# Patient Record
Sex: Male | Born: 1985 | Race: White | Hispanic: No | Marital: Single | Smoking: Current every day smoker
Health system: Southern US, Community
[De-identification: ages and names within clinical notes are randomized; demographics above are authoritative.]

---
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
## 2004-05-09 ENCOUNTER — Emergency Department (HOSPITAL_COMMUNITY): Admission: EM | Admit: 2004-05-09 | Discharge: 2004-05-09 | Payer: Self-pay | Admitting: Emergency Medicine

## 2005-04-05 ENCOUNTER — Emergency Department (HOSPITAL_COMMUNITY): Admission: EM | Admit: 2005-04-05 | Discharge: 2005-04-05 | Payer: Self-pay | Admitting: Emergency Medicine

## 2005-04-13 ENCOUNTER — Emergency Department (HOSPITAL_COMMUNITY): Admission: EM | Admit: 2005-04-13 | Discharge: 2005-04-13 | Payer: Self-pay | Admitting: Emergency Medicine

## 2005-06-10 ENCOUNTER — Ambulatory Visit: Payer: Self-pay | Admitting: Family Medicine

## 2005-06-10 ENCOUNTER — Emergency Department (HOSPITAL_COMMUNITY): Admission: EM | Admit: 2005-06-10 | Discharge: 2005-06-10 | Payer: Self-pay | Admitting: Emergency Medicine

## 2005-06-10 ENCOUNTER — Inpatient Hospital Stay (HOSPITAL_COMMUNITY): Admission: EM | Admit: 2005-06-10 | Discharge: 2005-06-11 | Payer: Self-pay | Admitting: Emergency Medicine

## 2006-09-28 ENCOUNTER — Emergency Department (HOSPITAL_COMMUNITY): Admission: EM | Admit: 2006-09-28 | Discharge: 2006-09-29 | Payer: Self-pay | Admitting: Emergency Medicine

## 2007-12-03 ENCOUNTER — Emergency Department (HOSPITAL_COMMUNITY): Admission: EM | Admit: 2007-12-03 | Discharge: 2007-12-03 | Payer: Self-pay | Admitting: Emergency Medicine

## 2008-01-07 ENCOUNTER — Emergency Department (HOSPITAL_COMMUNITY): Admission: EM | Admit: 2008-01-07 | Discharge: 2008-01-07 | Payer: Self-pay | Admitting: Emergency Medicine

## 2008-04-26 ENCOUNTER — Emergency Department (HOSPITAL_COMMUNITY): Admission: EM | Admit: 2008-04-26 | Discharge: 2008-04-26 | Payer: Self-pay | Admitting: Emergency Medicine

## 2008-08-08 ENCOUNTER — Emergency Department (HOSPITAL_COMMUNITY): Admission: EM | Admit: 2008-08-08 | Discharge: 2008-08-08 | Payer: Self-pay | Admitting: Emergency Medicine

## 2008-10-04 ENCOUNTER — Emergency Department (HOSPITAL_COMMUNITY): Admission: EM | Admit: 2008-10-04 | Discharge: 2008-10-04 | Payer: Self-pay | Admitting: Emergency Medicine

## 2008-10-15 ENCOUNTER — Ambulatory Visit: Payer: Self-pay | Admitting: Psychiatry

## 2008-10-15 ENCOUNTER — Inpatient Hospital Stay (HOSPITAL_COMMUNITY): Admission: EM | Admit: 2008-10-15 | Discharge: 2008-10-16 | Payer: Self-pay | Admitting: Psychiatry

## 2008-10-15 ENCOUNTER — Emergency Department (HOSPITAL_COMMUNITY): Admission: EM | Admit: 2008-10-15 | Discharge: 2008-10-15 | Payer: Self-pay | Admitting: Emergency Medicine

## 2009-03-02 ENCOUNTER — Emergency Department (HOSPITAL_COMMUNITY): Admission: EM | Admit: 2009-03-02 | Discharge: 2009-03-02 | Payer: Self-pay | Admitting: Emergency Medicine

## 2009-03-16 ENCOUNTER — Emergency Department (HOSPITAL_COMMUNITY): Admission: EM | Admit: 2009-03-16 | Discharge: 2009-03-16 | Payer: Self-pay | Admitting: Emergency Medicine

## 2009-03-19 ENCOUNTER — Emergency Department (HOSPITAL_COMMUNITY): Admission: EM | Admit: 2009-03-19 | Discharge: 2009-03-19 | Payer: Self-pay | Admitting: Emergency Medicine

## 2009-03-22 ENCOUNTER — Emergency Department (HOSPITAL_COMMUNITY): Admission: EM | Admit: 2009-03-22 | Discharge: 2009-03-23 | Payer: Self-pay | Admitting: Emergency Medicine

## 2009-03-23 ENCOUNTER — Emergency Department (HOSPITAL_COMMUNITY): Admission: EM | Admit: 2009-03-23 | Discharge: 2009-03-23 | Payer: Self-pay | Admitting: Emergency Medicine

## 2010-10-11 LAB — DIFFERENTIAL
Basophils Absolute: 0.1 10*3/uL (ref 0.0–0.1)
Basophils Absolute: 0 10*3/uL (ref 0.0–0.1)
Basophils Absolute: 0.1 10*3/uL (ref 0.0–0.1)
Basophils Relative: 1 % (ref 0–1)
Basophils Relative: 1 % (ref 0–1)
Basophils Relative: 1 % (ref 0–1)
Eosinophils Absolute: 0.3 10*3/uL (ref 0.0–0.7)
Eosinophils Absolute: 0.2 10*3/uL (ref 0.0–0.7)
Eosinophils Relative: 3 % (ref 0–5)
Eosinophils Relative: 3 % (ref 0–5)
Lymphocytes Relative: 49 % — ABNORMAL HIGH (ref 12–46)
Lymphocytes Relative: 31 % (ref 12–46)
Lymphs Abs: 3.9 10*3/uL (ref 0.7–4.0)
Lymphs Abs: 2.6 10*3/uL (ref 0.7–4.0)
Monocytes Absolute: 0.6 10*3/uL (ref 0.1–1.0)
Monocytes Absolute: 0.9 10*3/uL (ref 0.1–1.0)
Monocytes Relative: 8 % (ref 3–12)
Neutro Abs: 3.2 10*3/uL (ref 1.7–7.7)
Neutro Abs: 4.5 10*3/uL (ref 1.7–7.7)
Neutro Abs: 6.4 10*3/uL (ref 1.7–7.7)
Neutrophils Relative %: 40 % — ABNORMAL LOW (ref 43–77)
Neutrophils Relative %: 56 % (ref 43–77)
Neutrophils Relative %: 58 % (ref 43–77)

## 2010-10-11 LAB — COMPREHENSIVE METABOLIC PANEL
ALT: 16 U/L (ref 0–53)
AST: 23 U/L (ref 0–37)
Albumin: 4.3 g/dL (ref 3.5–5.2)
Alkaline Phosphatase: 77 U/L (ref 39–117)
Alkaline Phosphatase: 104 U/L (ref 39–117)
Alkaline Phosphatase: 93 U/L (ref 39–117)
BUN: 8 mg/dL (ref 6–23)
BUN: 11 mg/dL (ref 6–23)
BUN: 9 mg/dL (ref 6–23)
CO2: 27 mEq/L (ref 19–32)
CO2: 25 mEq/L (ref 19–32)
Calcium: 9.1 mg/dL (ref 8.4–10.5)
Chloride: 105 mEq/L (ref 96–112)
Chloride: 107 mEq/L (ref 96–112)
Chloride: 109 mEq/L (ref 96–112)
Creatinine, Ser: 0.78 mg/dL (ref 0.4–1.5)
GFR calc Af Amer: >60 mL/min (ref >60)
GFR calc non Af Amer: >60 mL/min (ref >60)
GFR calc non Af Amer: >60 mL/min (ref >60)
Glucose, Bld: 96 mg/dL (ref 70–99)
Glucose, Bld: 90 mg/dL (ref 70–99)
Glucose, Bld: 92 mg/dL (ref 70–99)
Potassium: 3.7 mEq/L (ref 3.5–5.1)
Potassium: 3.9 mEq/L (ref 3.5–5.1)
Potassium: 4.1 mEq/L (ref 3.5–5.1)
Sodium: 138 mEq/L (ref 135–145)
Total Bilirubin: 0.6 mg/dL (ref 0.3–1.2)
Total Bilirubin: 0.7 mg/dL (ref 0.3–1.2)
Total Bilirubin: 0.7 mg/dL (ref 0.3–1.2)
Total Protein: 7.3 g/dL (ref 6.0–8.3)
Total Protein: 7.4 g/dL (ref 6.0–8.3)

## 2010-10-11 LAB — CBC
HCT: 42 % (ref 39.0–52.0)
HCT: 42.4 % (ref 39.0–52.0)
HCT: 42.8 % (ref 39.0–52.0)
HCT: 44.3 % (ref 39.0–52.0)
Hemoglobin: 14.3 g/dL (ref 13.0–17.0)
Hemoglobin: 14.5 g/dL (ref 13.0–17.0)
Hemoglobin: 14.6 g/dL (ref 13.0–17.0)
Hemoglobin: 15.2 g/dL (ref 13.0–17.0)
MCHC: 34.2 g/dL (ref 30.0–36.0)
MCV: 99.2 fL (ref 78.0–100.0)
Platelets: 214 10*3/uL (ref 150–400)
Platelets: 209 10*3/uL (ref 150–400)
RBC: 4.23 MIL/uL (ref 4.22–5.81)
RBC: 4.47 MIL/uL (ref 4.22–5.81)
RDW: 13 % (ref 11.5–15.5)
RDW: 12.9 % (ref 11.5–15.5)
RDW: 13 % (ref 11.5–15.5)
WBC: 8 10*3/uL (ref 4.0–10.5)
WBC: 11.4 10*3/uL — ABNORMAL HIGH (ref 4.0–10.5)
WBC: 8.4 10*3/uL (ref 4.0–10.5)

## 2010-10-11 LAB — URINALYSIS, ROUTINE W REFLEX MICROSCOPIC
Bilirubin Urine: NEGATIVE
Glucose, UA: NEGATIVE mg/dL
Hgb urine dipstick: NEGATIVE
Ketones, ur: NEGATIVE mg/dL
Ketones, ur: NEGATIVE mg/dL
Nitrite: NEGATIVE
Protein, ur: NEGATIVE mg/dL
Protein, ur: NEGATIVE mg/dL
Specific Gravity, Urine: 1.006 (ref 1.005–1.030)
Urobilinogen, UA: 0.2 mg/dL (ref 0.0–1.0)
Urobilinogen, UA: 0.2 mg/dL (ref 0.0–1.0)
pH: 6 (ref 5.0–8.0)

## 2010-10-11 LAB — ETHANOL
Alcohol, Ethyl (B): 261 mg/dL — ABNORMAL HIGH (ref 0–10)
Alcohol, Ethyl (B): <5 mg/dL (ref 0–10)

## 2010-10-11 LAB — LIPASE, BLOOD
Lipase: 34 U/L (ref 11–59)
Lipase: 17 U/L (ref 11–59)

## 2010-10-11 LAB — BASIC METABOLIC PANEL
GFR calc non Af Amer: >60 mL/min (ref >60)
Potassium: 4 mEq/L (ref 3.5–5.1)
Sodium: 139 mEq/L (ref 135–145)

## 2010-10-11 LAB — HEMOCCULT GUIAC POC 1CARD (OFFICE): Fecal Occult Bld: NEGATIVE

## 2010-10-16 LAB — CBC
HCT: 44.9 % (ref 39.0–52.0)
MCV: 97.1 fL (ref 78.0–100.0)
Platelets: 227 10*3/uL (ref 150–400)
RBC: 4.62 MIL/uL (ref 4.22–5.81)
WBC: 12.2 10*3/uL — ABNORMAL HIGH (ref 4.0–10.5)

## 2010-10-16 LAB — BASIC METABOLIC PANEL
BUN: 11 mg/dL (ref 6–23)
Chloride: 106 mEq/L (ref 96–112)
Creatinine, Ser: 0.96 mg/dL (ref 0.4–1.5)
GFR calc Af Amer: >60 mL/min (ref >60)
GFR calc non Af Amer: >60 mL/min (ref >60)
Potassium: 3.8 mEq/L (ref 3.5–5.1)

## 2010-10-16 LAB — SALICYLATE LEVEL: Salicylate Lvl: <4 mg/dL (ref 2.8–20.0)

## 2010-10-16 LAB — POCT I-STAT, CHEM 8
Calcium, Ion: 0.95 mmol/L — ABNORMAL LOW (ref 1.12–1.32)
Glucose, Bld: 76 mg/dL (ref 70–99)
HCT: 50 % (ref 39.0–52.0)
Hemoglobin: 17 g/dL (ref 13.0–17.0)
TCO2: 19 mmol/L (ref 0–100)

## 2010-10-16 LAB — ETHANOL: Alcohol, Ethyl (B): 296 mg/dL — ABNORMAL HIGH (ref 0–10)

## 2010-10-16 LAB — URINALYSIS, ROUTINE W REFLEX MICROSCOPIC
Bilirubin Urine: NEGATIVE
Leukocytes, UA: NEGATIVE
Nitrite: NEGATIVE
Specific Gravity, Urine: 1.025 (ref 1.005–1.030)
Urobilinogen, UA: 0.2 mg/dL (ref 0.0–1.0)
pH: 6 (ref 5.0–8.0)

## 2010-10-16 LAB — URINE MICROSCOPIC-ADD ON

## 2010-10-16 LAB — ACETAMINOPHEN LEVEL: Acetaminophen (Tylenol), Serum: <10 ug/mL — ABNORMAL LOW (ref 10–30)

## 2010-10-16 LAB — RAPID URINE DRUG SCREEN, HOSP PERFORMED: Barbiturates: NOT DETECTED

## 2010-10-17 LAB — POCT I-STAT, CHEM 8
BUN: 7 mg/dL (ref 6–23)
Calcium, Ion: 1.11 mmol/L — ABNORMAL LOW (ref 1.12–1.32)
Calcium, Ion: 1.11 mmol/L — ABNORMAL LOW (ref 1.12–1.32)
Chloride: 106 mEq/L (ref 96–112)
Glucose, Bld: 111 mg/dL — ABNORMAL HIGH (ref 70–99)
HCT: 52 % (ref 39.0–52.0)
HCT: 51 % (ref 39.0–52.0)
Hemoglobin: 17.7 g/dL — ABNORMAL HIGH (ref 13.0–17.0)
Hemoglobin: 17.3 g/dL — ABNORMAL HIGH (ref 13.0–17.0)
TCO2: 31 mmol/L (ref 0–100)
TCO2: 28 mmol/L (ref 0–100)

## 2010-10-17 LAB — URINALYSIS, ROUTINE W REFLEX MICROSCOPIC
Bilirubin Urine: NEGATIVE
Ketones, ur: NEGATIVE mg/dL
Nitrite: NEGATIVE
Protein, ur: NEGATIVE mg/dL
pH: 7 (ref 5.0–8.0)

## 2010-10-17 LAB — RAPID URINE DRUG SCREEN, HOSP PERFORMED
Cocaine: NOT DETECTED
Tetrahydrocannabinol: NOT DETECTED

## 2010-10-17 LAB — SALICYLATE LEVEL: Salicylate Lvl: <4 mg/dL (ref 2.8–20.0)

## 2010-10-17 LAB — ETHANOL
Alcohol, Ethyl (B): 387 mg/dL — ABNORMAL HIGH (ref 0–10)
Alcohol, Ethyl (B): 253 mg/dL — ABNORMAL HIGH (ref 0–10)
Alcohol, Ethyl (B): 351 mg/dL — ABNORMAL HIGH (ref 0–10)

## 2010-10-17 LAB — ACETAMINOPHEN LEVEL: Acetaminophen (Tylenol), Serum: <10 ug/mL — ABNORMAL LOW (ref 10–30)

## 2010-11-22 NOTE — Discharge Summary (Signed)
NAME:  Thomas Oneal, Thomas Oneal NO.:  [id number]   MEDICAL RECORD NO.:  [id number]          PATIENT TYPE:  INP   LOCATION:  5011                         FACILITY:  [facility]   PHYSICIAN:  Angeline Slim, M.D.  DATE OF BIRTH:  Sep 20, 1985   DATE OF ADMISSION:  06/10/2005  DATE OF DISCHARGE:  06/11/2005                                 DISCHARGE SUMMARY   DIAGNOSES:  1.  Drug overdose with Percocet, accidental.  2.  Nausea and vomiting.  3.  Increased bilirubin.  4.  Alcohol abuse.  5.  Marijuana abuse.  6.  Cocaine use.   MEDICATIONS:  None.   PROCEDURE:  None.   HOSPITAL COURSE:  The patient is a 25 year old white male.  The patient was  admitted on June 10, 2005 for nausea and vomiting, after an assessment in  the emergency department for drug overdose that occurred earlier in the  morning after he took 20 Percocet and a 6-pack of alcohol.  He was evaluated  with acetaminophen level that was on the non-injury portion of the nomogram  x2.  Urine drug screen was positive for marijuana and cocaine.  Just prior  to discharge from the emergency department, after evaluation of his drug  overdose, the patient experienced nausea and vomiting that was intractable  at the time, not responding to Zofran/Phenergan.  He was admitted thusly  overnight.  The next day, patient was able to keep down normal food.  We did  check a BMP just prior to discharge that showed very mild elevation of  indirect bilirubin and total bilirubin, with normal direct bilirubin.  Total  was 1.6, direct 0.8, indirect 1.4.  All other liver function tests were  normal.  At this point, the patient was discharged.   IMPORTANT LABORATORY:  As stated.  Total bilirubin 1.6, direct bilirubin  0.2, indirect bilirubin 1.4.  Other liver function tests within normal  limits.  Creatinine 0.9.  Tricyclics testing screen negative.  Urine drug  screen positive for cocaine and marijuana.  Acetaminophen first test  58.1,  second test 55.1.  Both in normal areas of nomogram.  Salicylate negative.  Alcohol level 82.  Hemoglobin of 14.9.   FOLLOWUP:  The patient will follow up with Dr. Irving Burton on June 16, 2005 at 3:00.   DIAGNOSES:  1.  Drug overdose.  The patient, I think, understands the foolishness of      actions.  He has no suicidal ideations and never did, this was just      taken in order to help ease his pain from recent medical problems of his      father.  2.  Nausea and vomiting.  The patient's nausea and vomiting resolved      spontaneously.  Discharged with no medications.  3.  Increased bilirubin.  This is most likely incidental.  We will follow      this up as an outpatient.  4.  Alcohol abuse/marijuana abuse/cocaine abuse.  The patient was given lots      of potential community resources for outpatient rehabilitation for these  problems, by the social worker, prior to discharge.      Angeline Slim, M.D.     AL/MEDQ  D:  06/11/2005  T:  06/12/2005  Job:  [id number]   cc:   Rondall Allegra, M.D.  Fax: [phone]
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
## 2010-11-22 NOTE — Discharge Summary (Signed)
NAME:  Thomas Oneal, Thomas Oneal NO.:  [id number]   MEDICAL RECORD NO.:  [id number]          PATIENT TYPE:  IPS   LOCATION:  0506                          FACILITY:  [facility]   PHYSICIAN:  Geoffery Lyons, M.D.      DATE OF BIRTH:  1986/05/03   DATE OF ADMISSION:  10/15/2008  DATE OF DISCHARGE:  10/16/2008                               DISCHARGE SUMMARY   IDENTIFYING INFORMATION:  This is a 25 year old Caucasian male.  This  was an involuntary admission.   HISTORY OF PRESENT ILLNESS:  First [facility] admission for this 25 year old  who was petitioned by his mother who had concerns that he had been  continuously intoxicated.  Then she found him at home  intoxicated  holding a piece of cut glass in his other hand and he had threatened  suicide by cutting himself.  He is living with his girlfriend.  The  girlfriend seemed unconcerned about his condition.  The mother felt that  by getting him petitioned, she would be sure that he had followup.  He  had presented a couple of times in the emergency room during the  previous week intoxicated and requesting detox or attempting to get into  [facility], but had been unable or had left the  emergency room against medical advice.  On presentation, he was  cooperative, recognized that he was a using too much alcohol and  admitted to abusing benzodiazepines.  Was wanting to re-achieve  abstinence, felt that his judgment was not good when he was under the  influence of substances, and was wanting to pursue outpatient treatment  and was cooperative.  Diagnostic studies were done in the emergency room  and revealed urine drug screen positive for opiates, negative for all  other substances.  Alcohol level was 296 mg/dL.  WBC mildly elevated at  12,200 and he had no somatic complaints.   PAST PSYCHIATRIC HISTORY:  Significant for no prior [facility] admissions.  He  has a history of one prior admission in his teen years and was treated  at that  time for polysubstance abuse and mood disorder NOS, but has no  memory of what medications he took.  Treated in 2006 for drug overdose  of Percocet along with some alcohol.  He currently has no outpatient  care.   MEDICAL HISTORY:  No regular primary care physician.  He takes no  medications regularly.  Denies any acute medical problems.   DISCHARGE MENTAL STATUS EXAM:  Revealed a pleasant, cooperative, fully  alert male in no distress with an appropriate affect.  Gives a candid  history with normal speech.  Mood is depressed, but denying any active  suicidal thoughts.  In full contact with reality.  No confusion or  delirium.  No signs of intoxication.  Denying any history of seizures or  withdrawal symptoms.  No signs of psychosis.  Cognition fully intact.   HOSPITAL COURSE:  He was admitted to our dual diagnosis program and was  cooperative with staff.  On the first day, we contact his mother who was  willing  to let him come home as he was requesting to come home as long  as he would follow up with outpatient services.  He agreed to follow up  with [facility] and appointments were secured.  He was  able to promise safety.   DISCHARGE MEDICATIONS:  None.   DISCHARGE DIAGNOSES:  AXIS I:  Substance-induced mood disorder, alcohol  abuse, polysubstance abuse.  AXIS II:  No diagnosis.  AXIS III:  No diagnosis.  AXIS IV:  Deferred.  AXIS V:  Current 58, past year 64 estimated.   PLAN:  The plan is to have him follow up with [facility] and appointments are scheduled as noted on his discharge  instruction sheet.  No medications at the time of discharge.      Margaret A. Scott, N.P.      Geoffery Lyons, M.D.  Electronically Signed    MAS/MEDQ  D:  10/17/2008  T:  10/17/2008  Job:  [id number]

## 2010-12-27 ENCOUNTER — Emergency Department (HOSPITAL_COMMUNITY)
Admission: EM | Admit: 2010-12-27 | Discharge: 2010-12-27 | Disposition: A | Discharge status: Home / Self Care | Payer: Self-pay | Attending: Emergency Medicine | Admitting: Emergency Medicine

## 2010-12-27 DIAGNOSIS — K2920 Alcoholic gastritis without bleeding: Secondary | ICD-10-CM | POA: Insufficient documentation

## 2010-12-27 DIAGNOSIS — R109 Unspecified abdominal pain: Secondary | ICD-10-CM | POA: Insufficient documentation

## 2010-12-27 DIAGNOSIS — R112 Nausea with vomiting, unspecified: Secondary | ICD-10-CM | POA: Insufficient documentation

## 2010-12-27 DIAGNOSIS — R5381 Other malaise: Secondary | ICD-10-CM | POA: Insufficient documentation

## 2010-12-27 DIAGNOSIS — F10229 Alcohol dependence with intoxication, unspecified: Secondary | ICD-10-CM | POA: Insufficient documentation

## 2010-12-27 DIAGNOSIS — R10819 Abdominal tenderness, unspecified site: Secondary | ICD-10-CM | POA: Insufficient documentation

## 2010-12-27 DIAGNOSIS — R5383 Other fatigue: Secondary | ICD-10-CM | POA: Insufficient documentation

## 2010-12-27 LAB — COMPREHENSIVE METABOLIC PANEL
AST: 29 U/L (ref 0–37)
Albumin: 4.6 g/dL (ref 3.5–5.2)
Calcium: 10 mg/dL (ref 8.4–10.5)
Chloride: 99 mEq/L (ref 96–112)
Creatinine, Ser: 0.78 mg/dL (ref 0.50–1.35)
Total Bilirubin: 1.4 mg/dL — ABNORMAL HIGH (ref 0.3–1.2)

## 2010-12-27 LAB — DIFFERENTIAL
Eosinophils Absolute: 0.1 10*3/uL (ref 0.0–0.7)
Eosinophils Relative: 1 % (ref 0–5)
Lymphs Abs: 1.8 10*3/uL (ref 0.7–4.0)

## 2010-12-27 LAB — CBC
MCV: 96.6 fL (ref 78.0–100.0)
Platelets: 265 10*3/uL (ref 150–400)
RDW: 12.7 % (ref 11.5–15.5)
WBC: 10.7 10*3/uL — ABNORMAL HIGH (ref 4.0–10.5)

## 2010-12-27 LAB — LIPASE, BLOOD: Lipase: 26 U/L (ref 11–59)

## 2011-04-02 LAB — POCT I-STAT, CHEM 8
HCT: 44
Hemoglobin: 15
Potassium: 4.2
Sodium: 140

## 2011-04-02 LAB — URINALYSIS, ROUTINE W REFLEX MICROSCOPIC
Nitrite: NEGATIVE
Specific Gravity, Urine: 1.026
pH: 5.5

## 2011-04-02 LAB — CK: Total CK: 146

## 2011-04-03 LAB — URINALYSIS, ROUTINE W REFLEX MICROSCOPIC
Bilirubin Urine: NEGATIVE
Glucose, UA: NEGATIVE
Hgb urine dipstick: NEGATIVE
Ketones, ur: NEGATIVE
Nitrite: NEGATIVE
Protein, ur: NEGATIVE
Specific Gravity, Urine: 1.012
Urobilinogen, UA: 1
pH: 6

## 2012-04-15 ENCOUNTER — Emergency Department (HOSPITAL_COMMUNITY)
Admission: EM | Admit: 2012-04-15 | Discharge: 2012-04-16 | Disposition: A | Discharge status: Home / Self Care | Payer: Self-pay | Attending: Emergency Medicine | Admitting: Emergency Medicine

## 2012-04-15 ENCOUNTER — Encounter (HOSPITAL_COMMUNITY): Payer: Self-pay | Admitting: *Deleted

## 2012-04-15 DIAGNOSIS — J020 Streptococcal pharyngitis: Secondary | ICD-10-CM | POA: Insufficient documentation

## 2012-04-15 DIAGNOSIS — R070 Pain in throat: Secondary | ICD-10-CM | POA: Insufficient documentation

## 2012-04-15 DIAGNOSIS — R599 Enlarged lymph nodes, unspecified: Secondary | ICD-10-CM | POA: Insufficient documentation

## 2012-04-15 DIAGNOSIS — R509 Fever, unspecified: Secondary | ICD-10-CM | POA: Insufficient documentation

## 2012-04-15 DIAGNOSIS — R21 Rash and other nonspecific skin eruption: Secondary | ICD-10-CM | POA: Insufficient documentation

## 2012-04-15 LAB — RAPID STREP SCREEN (MED CTR MEBANE ONLY): Streptococcus, Group A Screen (Direct): POSITIVE — AB

## 2012-04-15 MED ORDER — ACETAMINOPHEN 325 MG PO TABS
650.0000 mg | ORAL_TABLET | Freq: Once | ORAL | Status: AC
  Administered 2012-04-15: 650 mg via ORAL
  Filled 2012-04-15: qty 2

## 2012-04-15 MED ORDER — DEXAMETHASONE SODIUM PHOSPHATE 10 MG/ML IJ SOLN
10.0000 mg | Freq: Once | INTRAMUSCULAR | Status: AC
  Administered 2012-04-15: 10 mg via INTRAMUSCULAR
  Filled 2012-04-15: qty 1

## 2012-04-15 MED ORDER — PENICILLIN G BENZATHINE 1200000 UNIT/2ML IM SUSP
1.2000 10*6.[IU] | Freq: Once | INTRAMUSCULAR | Status: AC
  Administered 2012-04-15: 1.2 10*6.[IU] via INTRAMUSCULAR
  Filled 2012-04-15: qty 2

## 2012-04-15 NOTE — ED Provider Notes (Signed)
History     CSN: [id number]  Arrival date & time 04/15/12  2216   First MD Initiated Contact with Patient 04/15/12 2254      Chief Complaint  Patient presents with  . Sore Throat    (Consider location/radiation/quality/duration/timing/severity/associated sxs/prior treatment) Patient is a 26 y.o. male presenting with pharyngitis. The history is provided by the patient. No language interpreter was used.  Sore Throat This is a new problem. The current episode started in the past 7 days. The problem occurs constantly. The problem has been gradually worsening. Associated symptoms include a fever, a rash, a sore throat and swollen glands. The symptoms are aggravated by swallowing. He has tried acetaminophen for the symptoms. The treatment provided mild relief.    History reviewed. No pertinent past medical history.  History reviewed. No pertinent past surgical history.  No family history on file.  History  Substance Use Topics  . Smoking status: Current Every Day Smoker  . Smokeless tobacco: Not on file  . Alcohol Use: Yes      Review of Systems  Constitutional: Positive for fever.  HENT: Positive for sore throat.   Skin: Positive for rash.  All other systems reviewed and are negative.    Allergies  Review of patient's allergies indicates no known allergies.  Home Medications  No current outpatient prescriptions on file.  BP 110/67  Pulse 124  Temp 101.4 F (38.6 C) (Oral)  Resp 18  SpO2 98%  Physical Exam  Nursing note and vitals reviewed. Constitutional: He is oriented to person, place, and time. He appears well-developed and well-nourished.  HENT:  Head: Normocephalic.  Mouth/Throat: Oropharyngeal exudate present.  Eyes: Conjunctivae normal are normal. Pupils are equal, round, and reactive to light.  Neck: Normal range of motion.  Cardiovascular: Normal rate, regular rhythm and normal heart sounds.   Pulmonary/Chest: Effort normal and breath sounds  normal.  Abdominal: Soft. Bowel sounds are normal.  Musculoskeletal: Normal range of motion.  Lymphadenopathy:    He has cervical adenopathy.  Neurological: He is alert and oriented to person, place, and time.  Skin: Skin is warm. Rash noted.  Psychiatric: He has a normal mood and affect. His behavior is normal. Judgment and thought content normal.    ED Course  Procedures (including critical care time)  Labs Reviewed  RAPID STREP SCREEN - Abnormal; Notable for the following:    Streptococcus, Group A Screen (Direct) POSITIVE (*)     All other components within normal limits   No results found.   1. Strep pharyngitis    Symmetric swollen tonsils with exudate, oropharynx erythematous.  Patient is able to manage secretions, can swallow but with discomfort. Patient opted for IM injection of bicillin.  Will also give 10 mg decadron IM.  Patient has received tylenol for fever.  Will reassess for reduction in heart rate and temperature before discharge.  11:51 PM Patient remains tachycardic and febrile.  States he has not eaten or had much to drink over the last few days d/t the sore throat.  IV fluids with bolus ordered.  Dr. Judd Lien aware of patient. MDM          Jimmye Norman, NP 04/16/12 [phone]

## 2012-04-15 NOTE — ED Notes (Signed)
Pt c/o a sorethroat for 2 days with a temp

## 2012-04-15 NOTE — ED Notes (Signed)
1000 cc bolus running

## 2012-04-16 MED ORDER — HYDROCODONE-ACETAMINOPHEN 5-325 MG PO TABS: 1.0000 | ORAL_TABLET | ORAL | Status: DC | PRN

## 2012-04-16 MED ORDER — PREDNISONE 10 MG PO TABS: 20.0000 mg | ORAL_TABLET | Freq: Two times a day (BID) | ORAL | Status: DC

## 2012-04-16 MED ORDER — IBUPROFEN 400 MG PO TABS
800.0000 mg | ORAL_TABLET | Freq: Once | ORAL | Status: AC
  Administered 2012-04-16: 800 mg via ORAL
  Filled 2012-04-16: qty 2

## 2012-04-16 MED ORDER — SODIUM CHLORIDE 0.9 % IV BOLUS (SEPSIS)
1000.0000 mL | Freq: Once | INTRAVENOUS | Status: AC
  Administered 2012-04-16: 1000 mL via INTRAVENOUS

## 2012-04-16 NOTE — ED Provider Notes (Signed)
Patient reports feeling better.  Fever has resolved and HR improved.  D/c'd home w/ hydrocodone for pain as well as prednisone.  Return precautions discussed.   Otilio Miu, [place] 04/16/12 [phone]

## 2012-04-16 NOTE — ED Provider Notes (Signed)
Medical screening examination/treatment/procedure(s) were performed by non-physician practitioner and as supervising physician I was immediately available for consultation/collaboration.   Joya Gaskins, MD 04/16/12 [phone]

## 2012-04-19 NOTE — ED Provider Notes (Signed)
Medical screening examination/treatment/procedure(s) were performed by non-physician practitioner and as supervising physician I was immediately available for consultation/collaboration.   Joya Gaskins, MD 04/19/12 [phone]

## 2012-05-04 ENCOUNTER — Emergency Department (HOSPITAL_COMMUNITY)
Admission: EM | Admit: 2012-05-04 | Discharge: 2012-05-05 | Disposition: A | Discharge status: Home / Self Care | Payer: Self-pay | Attending: Emergency Medicine | Admitting: Emergency Medicine

## 2012-05-04 ENCOUNTER — Encounter (HOSPITAL_COMMUNITY): Payer: Self-pay | Admitting: *Deleted

## 2012-05-04 DIAGNOSIS — H168 Other keratitis: Secondary | ICD-10-CM

## 2012-05-04 DIAGNOSIS — H169 Unspecified keratitis: Secondary | ICD-10-CM | POA: Insufficient documentation

## 2012-05-04 DIAGNOSIS — F17200 Nicotine dependence, unspecified, uncomplicated: Secondary | ICD-10-CM | POA: Insufficient documentation

## 2012-05-04 MED ORDER — FLUORESCEIN SODIUM 1 MG OP STRP
ORAL_STRIP | OPHTHALMIC | Status: AC
  Filled 2012-05-04: qty 1

## 2012-05-04 MED ORDER — FLUORESCEIN SODIUM 1 MG OP STRP
1.0000 | ORAL_STRIP | Freq: Once | OPHTHALMIC | Status: DC
  Filled 2012-05-04: qty 1

## 2012-05-04 MED ORDER — TETRACAINE HCL 0.5 % OP SOLN
1.0000 [drp] | Freq: Once | OPHTHALMIC | Status: AC
  Administered 2012-05-04: 1 [drp] via OPHTHALMIC
  Filled 2012-05-04: qty 2

## 2012-05-04 MED ORDER — GATIFLOXACIN 0.5 % OP SOLN
1.0000 [drp] | Freq: Once | OPHTHALMIC | Status: AC
  Administered 2012-05-05: 1 [drp] via OPHTHALMIC
  Filled 2012-05-04: qty 2.5

## 2012-05-04 MED ORDER — OXYCODONE-ACETAMINOPHEN 5-325 MG PO TABS
2.0000 | ORAL_TABLET | Freq: Once | ORAL | Status: AC
  Administered 2012-05-05: 2 via ORAL
  Filled 2012-05-04: qty 2

## 2012-05-04 NOTE — ED Notes (Signed)
Pt states that he got something in his eye a couple of days ago. Pt states that he has tried eye drops to get the object out without any success. Pt complaining about right eye. Right pupil is dilated more than left and misshaped.

## 2012-05-04 NOTE — ED Notes (Signed)
tp reports eye irritation that started Saturday.  Reports pain increased until today when it became worse.  Pt has been putting 'over the counter' eye drops in his eye since Sunday with no relief.  Pts (R) eye noted to be fixed and dilated.  Unsure of name of eye drop.

## 2012-05-05 MED ORDER — HYDROMORPHONE HCL PF 1 MG/ML IJ SOLN
1.0000 mg | Freq: Once | INTRAMUSCULAR | Status: AC
  Administered 2012-05-05: 1 mg via INTRAMUSCULAR
  Filled 2012-05-05: qty 1

## 2012-05-05 MED ORDER — OXYCODONE-ACETAMINOPHEN 5-325 MG PO TABS: 2.0000 | ORAL_TABLET | ORAL | Status: DC | PRN

## 2012-05-05 NOTE — ED Provider Notes (Signed)
History     CSN: [id number]  Arrival date & time 05/04/12  2237   None     Chief Complaint  Patient presents with  . Foreign Body in Eye    (Consider location/radiation/quality/duration/timing/severity/associated sxs/prior treatment) HPI History provided by pt.   Pt presents w/ severe right eye pain.  Onset 3 days ago and gradually worsened.  Has been using an over the counter eye rinse as well as visine w/out relief.  Associated w/ photophobia as well as blurred vision that started today.  Denies fever.  Denies trauma.  Does not wear contact lenses.  History reviewed. No pertinent past medical history.  History reviewed. No pertinent past surgical history.  History reviewed. No pertinent family history.  History  Substance Use Topics  . Smoking status: Current Every Day Smoker  . Smokeless tobacco: Not on file  . Alcohol Use: Yes      Review of Systems  All other systems reviewed and are negative.    Allergies  Review of patient's allergies indicates no known allergies.  Home Medications   Current Outpatient Rx  Name Route Sig Dispense Refill  . HYDROCODONE-ACETAMINOPHEN 5-325 MG PO TABS Oral Take 1 tablet by mouth every 4 (four) hours as needed. For pain    . OXYCODONE-ACETAMINOPHEN 5-325 MG PO TABS Oral Take 2 tablets by mouth every 4 (four) hours as needed for pain. 20 tablet 0    BP 133/77  Pulse 109  Temp 98.9 F (37.2 C) (Oral)  Resp 20  SpO2 98%  Physical Exam  Nursing note and vitals reviewed. Constitutional: He is oriented to person, place, and time. He appears well-developed and well-nourished. No distress.  HENT:  Head: Normocephalic and atraumatic.  Eyes:       R pupil fixed and dilated.  Cornea cloudy.  Pinpoint ulceration at approx 3 o-clock.  EOMi.  No discharge.  Diffuse conjunctival injection. Tenderness of globe.  Pressure b/t 5 and .  Did not check visual acuity because my fingers are blurry when I hold them up a foot from his  face.    Neck: Normal range of motion.  Pulmonary/Chest: Effort normal.  Musculoskeletal: Normal range of motion.  Neurological: He is alert and oriented to person, place, and time.  Psychiatric: He has a normal mood and affect. His behavior is normal.    ED Course  Procedures (including critical care time)  Labs Reviewed - No data to display No results found.   1. Bacterial keratitis       MDM   Healthy 26yo M who does not wear contacts, presents w/ s/sx most consistent w/ acute bacterial keratitis.  Pt received both percocet and IM dilaudid for pain.  Dr. Clarisa Kindred called for consult and he requests that the patient receive zymaxid, one drop in R eye qhr while awake, and he will f/u with him in the office tomorrow afternoon.  I expressed my concern about delayed consultation and possibility for permanent vision loss, but he states that he would not treat him any differently if he saw him tonight.  Instructions relayed to patient, he received first dose of abx and d/c'd home w/ percocet for pain.  Return precautions discussed.        Otilio Miu, [place] 05/05/12 [phone]

## 2012-05-05 NOTE — ED Provider Notes (Signed)
Medical screening examination/treatment/procedure(s) were performed by non-physician practitioner and as supervising physician I was immediately available for consultation/collaboration.  John-Adam Aaralynn Shepheard, M.D.     John-Adam Daegen Berrocal, MD 05/05/12 0820 

## 2012-05-05 NOTE — ED Notes (Signed)
Medication has been dispensed from pharmacy.

## 2012-05-15 ENCOUNTER — Encounter (HOSPITAL_COMMUNITY): Payer: Self-pay | Admitting: Emergency Medicine

## 2012-05-15 ENCOUNTER — Emergency Department (HOSPITAL_COMMUNITY)
Admission: EM | Admit: 2012-05-15 | Discharge: 2012-05-15 | Disposition: A | Discharge status: Home / Self Care | Payer: Self-pay | Attending: Emergency Medicine | Admitting: Emergency Medicine

## 2012-05-15 DIAGNOSIS — IMO0002 Reserved for concepts with insufficient information to code with codable children: Secondary | ICD-10-CM

## 2012-05-15 DIAGNOSIS — T1590XA Foreign body on external eye, part unspecified, unspecified eye, initial encounter: Secondary | ICD-10-CM | POA: Insufficient documentation

## 2012-05-15 DIAGNOSIS — H209 Unspecified iridocyclitis: Secondary | ICD-10-CM | POA: Insufficient documentation

## 2012-05-15 DIAGNOSIS — Y939 Activity, unspecified: Secondary | ICD-10-CM | POA: Insufficient documentation

## 2012-05-15 DIAGNOSIS — S0550XA Penetrating wound with foreign body of unspecified eyeball, initial encounter: Secondary | ICD-10-CM | POA: Insufficient documentation

## 2012-05-15 DIAGNOSIS — F17200 Nicotine dependence, unspecified, uncomplicated: Secondary | ICD-10-CM | POA: Insufficient documentation

## 2012-05-15 DIAGNOSIS — Y929 Unspecified place or not applicable: Secondary | ICD-10-CM | POA: Insufficient documentation

## 2012-05-15 MED ORDER — TETRACAINE HCL 0.5 % OP SOLN
1.0000 [drp] | Freq: Once | OPHTHALMIC | Status: AC
  Administered 2012-05-15: 1 [drp] via OPHTHALMIC
  Filled 2012-05-15: qty 2

## 2012-05-15 MED ORDER — PILOCARPINE HCL 2 % OP SOLN
1.0000 [drp] | Freq: Four times a day (QID) | OPHTHALMIC | Status: DC
  Administered 2012-05-15: 1 [drp] via OPHTHALMIC
  Filled 2012-05-15: qty 15

## 2012-05-15 MED ORDER — OXYCODONE-ACETAMINOPHEN 5-325 MG PO TABS
1.0000 | ORAL_TABLET | Freq: Once | ORAL | Status: AC
  Administered 2012-05-15: 1 via ORAL
  Filled 2012-05-15: qty 1

## 2012-05-15 NOTE — ED Notes (Signed)
Pt states he is unable to see past 5 inches with his right eye.  20/20 in left eye

## 2012-05-15 NOTE — ED Provider Notes (Signed)
History    This chart was scribed for Thomas Sprout, MD, MD by Smitty Pluck, ED Scribe. The patient was seen in room TR04C and the patient's care was started at 11:56AM.   CSN: [id number]  Arrival date & time 05/15/12  1059      Chief Complaint  Patient presents with  . Eye Problem    (Consider location/radiation/quality/duration/timing/severity/associated sxs/prior treatment) The history is provided by the patient. No language interpreter was used.   Thomas Oneal is a 26 y.o. male who presents to the Emergency Department complaining of constant, moderate right eye pain onset 1.5 weeks ago. Pt was in ED 1.5 weeks ago with same symptoms but did not go to his appointment for optometrist. Pt reports having photophobia and having water drainage. He states having blurred vision in right eye. He denies wearing contacts, glasses and injury to right eye. He states that he has used abx eye drops without relief . Denies any other current pain.   History reviewed. No pertinent past medical history.  History reviewed. No pertinent past surgical history.  No family history on file.  History  Substance Use Topics  . Smoking status: Current Every Day Smoker  . Smokeless tobacco: Not on file  . Alcohol Use: Yes      Review of Systems  Constitutional: Negative for fever and chills.  Eyes: Positive for pain.  Respiratory: Negative for shortness of breath.   Gastrointestinal: Negative for nausea and vomiting.  Neurological: Negative for weakness.  All other systems reviewed and are negative.    Allergies  Review of patient's allergies indicates no known allergies.  Home Medications  No current outpatient prescriptions on file.  BP 131/86  Pulse 99  Temp 98.1 F (36.7 C) (Oral)  Resp 16  SpO2 100%  Physical Exam  Nursing note and vitals reviewed. Constitutional: He is oriented to person, place, and time. He appears well-developed and well-nourished. No distress.  HENT:    Head: Normocephalic and atraumatic.  Eyes: EOM are normal. Right conjunctiva is injected.         Pupils are 5 mm on right and 4 mm on left Consensual photophobia    Neck: Neck supple. No tracheal deviation present.  Pulmonary/Chest: Effort normal. No respiratory distress.  Musculoskeletal: Normal range of motion.  Neurological: He is alert and oriented to person, place, and time.  Skin: Skin is warm and dry.  Psychiatric: He has a normal mood and affect. His behavior is normal.    ED Course  Procedures (including critical care time) DIAGNOSTIC STUDIES: Oxygen Saturation is 100% on room air, normal by my interpretation.    COORDINATION OF CARE: 12:01 PM Discussed ED treatment with pt  12:04 PM Ordered:    . [COMPLETED] tetracaine  1 drop Right Eye Once       Labs Reviewed - No data to display No results found.   1. Foreign body   2. Iritis       MDM   Patient seen here approximately one and a half weeks ago for eye pain. He states he was diagnosed with bacterial conjunctivitis and recommended to followup with the eye doctor. However he states he was not able to afford to see a doctor but symptoms are worsening. Today on exam patient has evidence of a foreign body in the right eye with arrest ring present. He also has evidence of iritis with injection, photophobia and consensual photophobia. He complains that his vision is blurry and his pupil is dilated.  He is 20/20 on the left and at 5 feet he states everything looks blurry on the right. He is able to discern light and dark.  12:26 PM Ophtho can see him today at 1    I personally performed the services described in this documentation, which was scribed in my presence.  The recorded information has been reviewed and considered.   Thomas Sprout, MD 05/15/12 1228

## 2012-05-15 NOTE — ED Notes (Signed)
Pt. Here last Mon with the same problem. Stated, i was suppose to see an eye Dr.  I missed the appt.

## 2012-05-15 NOTE — ED Notes (Signed)
Family member at bedside.

## 2013-03-12 ENCOUNTER — Emergency Department (HOSPITAL_COMMUNITY)
Admission: EM | Admit: 2013-03-12 | Discharge: 2013-03-12 | Disposition: A | Discharge status: Home / Self Care | Payer: Self-pay | Attending: Emergency Medicine | Admitting: Emergency Medicine

## 2013-03-12 ENCOUNTER — Emergency Department (HOSPITAL_COMMUNITY): Payer: Self-pay

## 2013-03-12 ENCOUNTER — Encounter (HOSPITAL_COMMUNITY): Payer: Self-pay

## 2013-03-12 DIAGNOSIS — IMO0002 Reserved for concepts with insufficient information to code with codable children: Secondary | ICD-10-CM | POA: Insufficient documentation

## 2013-03-12 DIAGNOSIS — F10929 Alcohol use, unspecified with intoxication, unspecified: Secondary | ICD-10-CM

## 2013-03-12 DIAGNOSIS — S0081XA Abrasion of other part of head, initial encounter: Secondary | ICD-10-CM

## 2013-03-12 DIAGNOSIS — F17200 Nicotine dependence, unspecified, uncomplicated: Secondary | ICD-10-CM | POA: Insufficient documentation

## 2013-03-12 DIAGNOSIS — H209 Unspecified iridocyclitis: Secondary | ICD-10-CM

## 2013-03-12 DIAGNOSIS — S20212A Contusion of left front wall of thorax, initial encounter: Secondary | ICD-10-CM

## 2013-03-12 DIAGNOSIS — Y9355 Activity, bike riding: Secondary | ICD-10-CM | POA: Insufficient documentation

## 2013-03-12 DIAGNOSIS — S060X9A Concussion with loss of consciousness of unspecified duration, initial encounter: Secondary | ICD-10-CM | POA: Insufficient documentation

## 2013-03-12 DIAGNOSIS — Y92410 Unspecified street and highway as the place of occurrence of the external cause: Secondary | ICD-10-CM | POA: Insufficient documentation

## 2013-03-12 DIAGNOSIS — F1010 Alcohol abuse, uncomplicated: Secondary | ICD-10-CM | POA: Insufficient documentation

## 2013-03-12 DIAGNOSIS — S20219A Contusion of unspecified front wall of thorax, initial encounter: Secondary | ICD-10-CM

## 2013-03-12 LAB — COMPREHENSIVE METABOLIC PANEL
ALT: 21 U/L (ref 0–53)
Alkaline Phosphatase: 90 U/L (ref 39–117)
BUN: 14 mg/dL (ref 6–23)
Chloride: 105 mEq/L (ref 96–112)
GFR calc Af Amer: >90 mL/min (ref >90)
Glucose, Bld: 91 mg/dL (ref 70–99)
Potassium: 3.6 mEq/L (ref 3.5–5.1)
Sodium: 143 mEq/L (ref 135–145)
Total Bilirubin: 0.2 mg/dL — ABNORMAL LOW (ref 0.3–1.2)

## 2013-03-12 LAB — URINALYSIS, ROUTINE W REFLEX MICROSCOPIC
Bilirubin Urine: NEGATIVE
Hgb urine dipstick: NEGATIVE
Ketones, ur: NEGATIVE mg/dL
Nitrite: NEGATIVE
Specific Gravity, Urine: 1.015 (ref 1.005–1.030)
Urobilinogen, UA: 1 mg/dL (ref 0.0–1.0)
pH: 6 (ref 5.0–8.0)

## 2013-03-12 LAB — CBC WITH DIFFERENTIAL/PLATELET
Hemoglobin: 14 g/dL (ref 13.0–17.0)
Lymphocytes Relative: 44 % (ref 12–46)
Lymphs Abs: 3.4 10*3/uL (ref 0.7–4.0)
Monocytes Relative: 8 % (ref 3–12)
Neutro Abs: 3.4 10*3/uL (ref 1.7–7.7)
Neutrophils Relative %: 44 % (ref 43–77)
Platelets: 234 10*3/uL (ref 150–400)
RBC: 4.06 MIL/uL — ABNORMAL LOW (ref 4.22–5.81)
WBC: 7.7 10*3/uL (ref 4.0–10.5)

## 2013-03-12 LAB — ETHANOL: Alcohol, Ethyl (B): 149 mg/dL — ABNORMAL HIGH (ref 0–11)

## 2013-03-12 MED ORDER — OXYCODONE-ACETAMINOPHEN 5-325 MG PO TABS: 1.0000 | ORAL_TABLET | Freq: Three times a day (TID) | ORAL | Status: DC | PRN

## 2013-03-12 MED ORDER — FLUORESCEIN SODIUM 10 % IJ SOLN: 500.0000 mg | Freq: Once | INTRAMUSCULAR | Status: DC

## 2013-03-12 MED ORDER — TETRACAINE HCL 0.5 % OP SOLN
1.0000 [drp] | Freq: Once | OPHTHALMIC | Status: AC
  Administered 2013-03-12: 1 [drp] via OPHTHALMIC
  Filled 2013-03-12: qty 2

## 2013-03-12 MED ORDER — MORPHINE SULFATE 4 MG/ML IJ SOLN
4.0000 mg | Freq: Once | INTRAMUSCULAR | Status: AC
  Administered 2013-03-12: 4 mg via INTRAVENOUS
  Filled 2013-03-12: qty 1

## 2013-03-12 MED ORDER — FLUORESCEIN SODIUM 1 MG OP STRP
1.0000 | ORAL_STRIP | Freq: Once | OPHTHALMIC | Status: AC
  Administered 2013-03-12: 1 via OPHTHALMIC
  Filled 2013-03-12: qty 1

## 2013-03-12 MED ORDER — IOHEXOL 300 MG/ML  SOLN
80.0000 mL | Freq: Once | INTRAMUSCULAR | Status: AC | PRN
  Administered 2013-03-12: 80 mL via INTRAVENOUS

## 2013-03-12 MED ORDER — SODIUM CHLORIDE 0.9 % IV BOLUS (SEPSIS)
1000.0000 mL | INTRAVENOUS | Status: AC
  Administered 2013-03-12: 1000 mL via INTRAVENOUS
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
## 2013-03-12 NOTE — ED Notes (Signed)
MD at bedside. 
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
## 2014-10-12 ENCOUNTER — Encounter (HOSPITAL_COMMUNITY): Payer: Self-pay | Admitting: *Deleted

## 2014-10-12 ENCOUNTER — Emergency Department (HOSPITAL_COMMUNITY)
Admission: EM | Admit: 2014-10-12 | Discharge: 2014-10-12 | Disposition: A | Discharge status: Home / Self Care | Payer: Self-pay | Attending: Emergency Medicine | Admitting: Emergency Medicine

## 2014-10-12 DIAGNOSIS — Y278XXA Contact with other hot objects, undetermined intent, initial encounter: Secondary | ICD-10-CM | POA: Insufficient documentation

## 2014-10-12 DIAGNOSIS — Y998 Other external cause status: Secondary | ICD-10-CM | POA: Insufficient documentation

## 2014-10-12 DIAGNOSIS — Y9389 Activity, other specified: Secondary | ICD-10-CM | POA: Insufficient documentation

## 2014-10-12 DIAGNOSIS — T23211A Burn of second degree of right thumb (nail), initial encounter: Secondary | ICD-10-CM

## 2014-10-12 DIAGNOSIS — Y9289 Other specified places as the place of occurrence of the external cause: Secondary | ICD-10-CM | POA: Insufficient documentation

## 2014-10-12 DIAGNOSIS — Z720 Tobacco use: Secondary | ICD-10-CM | POA: Insufficient documentation

## 2014-10-12 DIAGNOSIS — T23241A Burn of second degree of multiple right fingers (nail), including thumb, initial encounter: Secondary | ICD-10-CM | POA: Insufficient documentation

## 2014-10-12 DIAGNOSIS — K011 Impacted teeth: Secondary | ICD-10-CM | POA: Insufficient documentation

## 2014-10-12 MED ORDER — OXYCODONE-ACETAMINOPHEN 5-325 MG PO TABS
1.0000 | ORAL_TABLET | Freq: Once | ORAL | Status: AC
  Administered 2014-10-12: 1 via ORAL
  Filled 2014-10-12: qty 1

## 2014-10-12 MED ORDER — SILVER SULFADIAZINE 1 % EX CREA
TOPICAL_CREAM | Freq: Once | CUTANEOUS | Status: AC
  Administered 2014-10-12: 02:00:00 via TOPICAL
  Filled 2014-10-12: qty 85

## 2014-10-12 MED ORDER — OXYCODONE-ACETAMINOPHEN 5-325 MG PO TABS
1.0000 | ORAL_TABLET | ORAL | Status: DC | PRN
Start: 2014-10-12 — End: 2015-01-18

## 2014-10-12 MED ORDER — SILVER SULFADIAZINE 1 % EX CREA: 1.0000 "application " | TOPICAL_CREAM | Freq: Every day | CUTANEOUS | Status: DC

## 2014-10-12 MED ORDER — IBUPROFEN 800 MG PO TABS
800.0000 mg | ORAL_TABLET | Freq: Once | ORAL | Status: AC
  Administered 2014-10-12: 800 mg via ORAL
  Filled 2014-10-12: qty 1
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
## 2014-10-12 NOTE — ED Provider Notes (Signed)
CSN: [id number]     Arrival date & time 10/12/14  0041 History   First MD Initiated Contact with Patient 10/12/14 0122     This chart was scribed for Thomas Booze, MD by Thomas Oneal, ED Scribe. This patient was seen in room A04C/A04C and the patient's care was started 1:24 AM.   Chief Complaint  Patient presents with  . Burn   The history is provided by the patient. No language interpreter was used.    HPI Comments: Thomas Oneal is a 29 y.o. male without any pertinent past medical history who presents to the Emergency Department complaining of a burn to the R thumb sustained approximately 1 hour prior to arrival. Pt states his fiances car was running hot this evening and attempted to repair her vehicle prior to her leaving for work. When he went to grab the cap pt was unaware of how hot it was resulting in him burning his finger. He now c/o constant, ongoing pain to the R thumb. Pain is described as burning and currently rated 10/10 with palpation. Thomas Oneal immediately ran his finger under cold water after incident. However, no OTC medications or home remedies attempted prior to arrival. No recent fever or chills. No known allergies to medications.  History reviewed. No pertinent past medical history. History reviewed. No pertinent past surgical history. No family history on file. History  Substance Use Topics  . Smoking status: Current Every Day Smoker  . Smokeless tobacco: Not on file  . Alcohol Use: Yes    Review of Systems  Constitutional: Negative for fever and chills.  HENT: Positive for dental problem.   Skin: Positive for wound.  All other systems reviewed and are negative.     Allergies  Review of patient's allergies indicates no known allergies.  Home Medications   Prior to Admission medications   Medication Sig Start Date End Date Taking? Authorizing Provider  oxyCODONE-acetaminophen (PERCOCET) 5-325 MG per tablet Take 1 tablet by mouth every 8 (eight) hours as  needed for pain. 03/12/13   Thomas Sheffield, MD   Triage Vitals: BP 127/78 mmHg  Pulse 104  Temp(Src) 98.2 F (36.8 C)  Resp 16  Ht  (1.676 m)  Wt 141 lb (63.957 kg)  BMI 22.77 kg/m2  SpO2 98%   Physical Exam  Constitutional: He is oriented to person, place, and time. He appears well-developed and well-nourished.  HENT:  Head: Normocephalic and atraumatic.  Tooth number 15 is impacted Tooth number 16 has been extracted   Eyes: EOM are normal. Pupils are equal, round, and reactive to light.  Neck: Normal range of motion. No JVD present.  Cardiovascular: Normal rate, regular rhythm, normal heart sounds and intact distal pulses.   No murmur heard. Pulmonary/Chest: Effort normal and breath sounds normal. He has no wheezes. He has no rales. He exhibits no tenderness.  Abdominal: Soft. Bowel sounds are normal. He exhibits no distension and no mass. There is no tenderness.  Musculoskeletal: Normal range of motion. He exhibits no edema.  Second degree burn to dorsum of proximal phalynx of R thumb extending to dorsum of hand  Less than 1% of total body surface area  Lymphadenopathy:    He has no cervical adenopathy.  Neurological: He is alert and oriented to person, place, and time. No cranial nerve deficit. Coordination normal.  Skin: Skin is warm and dry.  Psychiatric: He has a normal mood and affect. His behavior is normal. Judgment and thought content normal.  Nursing  note and vitals reviewed.   ED Course  Procedures (including critical care time)  DIAGNOSTIC STUDIES: Oxygen Saturation is 98% on RA, Normal by my interpretation.    COORDINATION OF CARE: 1:23 AM-Discussed treatment plan with pt at bedside and pt agreed to plan.       MDM   Final diagnoses:  Second degree burn of right thumb, initial encounter  Impacted tooth    Second-degree burn of right thumb. Silvadene dressing is applied. Because burn crosses the MCP joint, he is referred to hand surgery to  ensure that he does not develop a contracture. He is discharged with prescriptions for Silvadene cream and oxycodone acetaminophen. Regarding his impacted tooth, he is given dental resources.  I personally performed the services described in this documentation, which was scribed in my presence. The recorded information has been reviewed and is accurate.      Thomas Boozeavid Shahd Occhipinti, MD 10/12/14 [phone]

## 2014-10-12 NOTE — Discharge Instructions (Signed)
See a dentist about your tooth.  Take naproxen - two tablets, twice a day. Alternatively, you can take ibuprofen - three tablets, four times a day.   Burn Care Your skin is a natural barrier to infection. It is the largest organ of your body. Burns damage this natural protection. To help prevent infection, it is very important to follow your caregiver's instructions in the care of your burn. Burns are classified as:  First degree. There is only redness of the skin (erythema). No scarring is expected.  Second degree. There is blistering of the skin. Scarring may occur with deeper burns.  Third degree. All layers of the skin are injured, and scarring is expected. HOME CARE INSTRUCTIONS   Wash your hands well before changing your bandage.  Change your bandage as often as directed by your caregiver.  Remove the old bandage. If the bandage sticks, you may soak it off with cool, clean water.  Cleanse the burn thoroughly but gently with mild soap and water.  Pat the area dry with a clean, dry cloth.  Apply a thin layer of antibacterial cream to the burn.  Apply a clean bandage as instructed by your caregiver.  Keep the bandage as clean and dry as possible.  Elevate the affected area for the first 24 hours, then as instructed by your caregiver.  Only take over-the-counter or prescription medicines for pain, discomfort, or fever as directed by your caregiver. SEEK IMMEDIATE MEDICAL CARE IF:   You develop excessive pain.  You develop redness, tenderness, swelling, or red streaks near the burn.  The burned area develops yellowish-white fluid (pus) or a bad smell.  You have a fever. MAKE SURE YOU:   Understand these instructions.  Will watch your condition.  Will get help right away if you are not doing well or get worse. Document Released: 06/23/2005 Document Revised: 09/15/2011 Document Reviewed: 11/13/2010 [facility] Patient Information 2015 [place], [place]. This information  is not intended to replace advice given to you by your health care provider. Make sure you discuss any questions you have with your health care provider.   Emergency Department Resource Guide  Dental Care: Organization         Address  Phone  Notes  [facility] Department of [facility] [address] [place], [place] [phone] Accepts children up to age 68 who are enrolled in [place] or [place] Health Choice; pregnant women with a Medicaid card; and children who have applied for Medicaid or [place] Health Choice, but were declined, whose parents can pay a reduced fee at time of service.  [facility] Department of [facility]  [address], [place] [phone] Accepts children up to age 84 who are enrolled in [place] or [place] Health Choice; pregnant women with a Medicaid card; and children who have applied for Medicaid or [place] Health Choice, but were declined, whose parents can pay a reduced fee at time of service.  [facility]  [address] [employer], [place] [phone] Patients are seen by appointment only. Walk-ins are not accepted. [facility] will see patients 75 years of age and older. Monday - Tuesday (8am-5pm) Most Wednesdays (8:30-5pm) $30 per visit, cash only  [facility]  [address], [facility] [phone] Patients are seen by appointment only. Walk-ins are not accepted. [facility] will see patients 76 years of age and older. One Wednesday Evening (Monthly: Volunteer Based).  $  30 per visit, cash only  [facility] [facility]  [phone] for adults; Children under age 804, call Graduate Pediatric Dentistry at [phone]. Children aged 404-14, please call [phone] to request a pediatric application.  Dental services are provided in all areas of dental care including fillings, crowns and bridges, complete and partial dentures, implants, gum  treatment, root canals, and extractions. Preventive care is also provided. Treatment is provided to both adults and children. Patients are selected via a lottery and there is often a waiting list.   [facility] [address], [place]  [phone] [email or web address]   [facility] [address], [place], [place] [phone], Ext. [phone] Second and Fourth Thursday of each month, opens at 6:30 AM; Clinic ends at 9 AM.  Patients are seen on a first-come first-served basis, and a limited number are seen during each clinic.   [facility]  [address], [place], [place] [phone]   Eligibility Requirements You must have lived in [place], [place], or [place] counties for at least the last three months.   You cannot be eligible for state or federal sponsored [place] insurance, including [employer], [place], or [employer]are.   You generally cannot be eligible for healthcare insurance through your employer.    How to apply: Eligibility screenings are held every Tuesday and Wednesday afternoon from 1:00 pm until 4:00 pm. You do not need an appointment for the interview!  [facility] [address], [place], [place] [phone]   [facility]  [phone]   [facility]  [phone]   [facility]  [phone]

## 2015-01-18 ENCOUNTER — Emergency Department (HOSPITAL_COMMUNITY): Payer: Self-pay

## 2015-01-18 ENCOUNTER — Emergency Department (HOSPITAL_COMMUNITY)
Admission: EM | Admit: 2015-01-18 | Discharge: 2015-01-18 | Disposition: A | Discharge status: Home / Self Care | Payer: Self-pay | Attending: Emergency Medicine | Admitting: Emergency Medicine

## 2015-01-18 ENCOUNTER — Encounter (HOSPITAL_COMMUNITY): Payer: Self-pay | Admitting: Neurology

## 2015-01-18 DIAGNOSIS — S20211A Contusion of right front wall of thorax, initial encounter: Secondary | ICD-10-CM | POA: Insufficient documentation

## 2015-01-18 DIAGNOSIS — Y9289 Other specified places as the place of occurrence of the external cause: Secondary | ICD-10-CM | POA: Insufficient documentation

## 2015-01-18 DIAGNOSIS — Z720 Tobacco use: Secondary | ICD-10-CM | POA: Insufficient documentation

## 2015-01-18 DIAGNOSIS — Y9389 Activity, other specified: Secondary | ICD-10-CM | POA: Insufficient documentation

## 2015-01-18 DIAGNOSIS — Y998 Other external cause status: Secondary | ICD-10-CM | POA: Insufficient documentation

## 2015-01-18 DIAGNOSIS — W010XXA Fall on same level from slipping, tripping and stumbling without subsequent striking against object, initial encounter: Secondary | ICD-10-CM | POA: Insufficient documentation

## 2015-01-18 MED ORDER — IBUPROFEN 400 MG PO TABS
800.0000 mg | ORAL_TABLET | Freq: Once | ORAL | Status: AC
  Administered 2015-01-18: 800 mg via ORAL
  Filled 2015-01-18: qty 2

## 2015-01-18 MED ORDER — IBUPROFEN 800 MG PO TABS: 800.0000 mg | ORAL_TABLET | Freq: Three times a day (TID) | ORAL | Status: DC

## 2015-01-18 MED ORDER — METHOCARBAMOL 500 MG PO TABS
500.0000 mg | ORAL_TABLET | Freq: Once | ORAL | Status: AC
  Administered 2015-01-18: 500 mg via ORAL
  Filled 2015-01-18: qty 1

## 2015-01-18 MED ORDER — METHOCARBAMOL 500 MG PO TABS: 500.0000 mg | ORAL_TABLET | Freq: Two times a day (BID) | ORAL | Status: DC

## 2015-01-18 NOTE — ED Notes (Signed)
Patient states he fell last week chasing his daughter and states fell on right side and hs had rib pain since

## 2015-01-18 NOTE — ED Provider Notes (Signed)
CSN: [id number]   Arrival date & time 01/18/15 1652  History  This chart was scribed for non-physician practitioner, Thomas ForthHannah Korver Graybeal PA-C, working with Thomas SproutWhitney Plunkett, MD by Thomas Oneal, ED Scribe. This patient was seen in room TR05C/TR05C and the patient's care was started at 6:24 PM.  Chief Complaint  Patient presents with  . Rib Injury    HPI The history is provided by the patient and medical records. No language interpreter was used.   Thomas Oneal is a 29 y.o. male who presents to the Emergency Department complaining of constant right-sided rib pain with onset 1 week ago after a fall. The pt was chasing his daughter and fell on crossties in his driveway landing on his right side. He rates the pain 8/10 in severity and describes it as sore. Movement exacerbates the pain. Tylenol provided insufficient pain relief PTA.  No head injury or LOC. Pt denies abdominal pain.  History reviewed. No pertinent past medical history.  History reviewed. No pertinent past surgical history.  No family history on file.  History  Substance Use Topics  . Smoking status: Current Every Day Smoker  . Smokeless tobacco: Not on file  . Alcohol Use: Yes     Review of Systems  Constitutional: Negative for fever and chills.  HENT: Negative for dental problem, facial swelling and nosebleeds.   Eyes: Negative for visual disturbance.  Respiratory: Negative for cough, chest tightness, shortness of breath, wheezing and stridor.   Cardiovascular: Negative for chest pain.  Gastrointestinal: Negative for nausea, vomiting and abdominal pain.  Genitourinary: Negative for dysuria, hematuria and flank pain.  Musculoskeletal: Negative for back pain, joint swelling, arthralgias, gait problem, neck pain and neck stiffness.       Right-sided rib pain  Skin: Negative for rash and wound.  Neurological: Negative for syncope, weakness, light-headedness, numbness and headaches.  Hematological: Does not bruise/bleed  easily.  Psychiatric/Behavioral: The patient is not nervous/anxious.   All other systems reviewed and are negative.   Home Medications   Prior to Admission medications   Medication Sig Start Date End Date Taking? Authorizing Provider  ibuprofen (ADVIL,MOTRIN) 800 MG tablet Take 1 tablet (800 mg total) by mouth 3 (three) times daily. 01/18/15   Thomas Mccall, PA-C  methocarbamol (ROBAXIN) 500 MG tablet Take 1 tablet (500 mg total) by mouth 2 (two) times daily. 01/18/15   Thomas Zebrowski, PA-C    Allergies  Review of patient's allergies indicates no known allergies.  Triage Vitals: BP 131/78 mmHg  Pulse 89  Temp(Src) 98.4 F (36.9 C) (Oral)  Resp 16  SpO2 100%  Physical Exam  Constitutional: He appears well-developed and well-nourished. No distress.  Awake, alert, nontoxic appearance  HENT:  Head: Normocephalic and atraumatic.  Mouth/Throat: Oropharynx is clear and moist. No oropharyngeal exudate.  Eyes: Conjunctivae are normal. No scleral icterus.  Neck: Normal range of motion. Neck supple.  Cardiovascular: Normal rate, regular rhythm, normal heart sounds and intact distal pulses.   No murmur heard. Pulmonary/Chest: Effort normal and breath sounds normal. No respiratory distress. He has no wheezes.  Equal chest expansion Clear and equal breath sounds TTP of the right lateral proximal ribs without ecchymosis, deformity, crepitus, or flail segment  Abdominal: Soft. Bowel sounds are normal. He exhibits no mass. There is no tenderness. There is no rebound and no guarding.  Musculoskeletal: Normal range of motion. He exhibits no edema.  No TTP of the midline or paraspinal T-spine or L-spine   Neurological: He is alert.  Speech  is clear and goal oriented Moves extremities without ataxia  Skin: Skin is warm and dry. He is not diaphoretic.  Psychiatric: He has a normal mood and affect.  Nursing note and vitals reviewed.   ED Course  Procedures   DIAGNOSTIC  STUDIES: Oxygen Saturation is 100% on RA, normal by my interpretation.    COORDINATION OF CARE: 6:27 PM Discussed treatment plan which includes right rib and chest XR, ibuprofen, and Robaxin with pt at bedside and pt agreed to plan.  Labs Review- Labs Reviewed - No data to display  Imaging Review Dg Ribs Unilateral W/chest Right  01/18/2015   CLINICAL DATA:  Right-sided rib pain following fall onto railroad tie, initial encounter  EXAM: RIGHT RIBS AND CHEST - 3+ VIEW  COMPARISON:  None.  FINDINGS: Cardiac shadow is within normal limits. The lungs are clear bilaterally. No acute rib fracture is seen. No pneumothorax is noted.  IMPRESSION: No acute abnormality seen.   Electronically Signed   By: Alcide Clever M.D.   On: 01/18/2015 17:35    EKG Interpretation None      MDM   Final diagnoses:  Rib contusion, right, initial encounter  Fall from slip, trip, or stumble, initial encounter  Chest wall contusion, right, initial encounter    Thomas Oneal    To the emergency department complaining of greater than one week of right rib pain after fall. No flail segment, deformity, ecchymosis.  Clear and equal breath sounds.   Right rib films without evidence of fracture. No pneumothorax. Patient given muscle relaxer and anti-inflammatory. Patient also discharged home with incentive spirometer to prevent pneumonia. Strict return precautions given including shortness of breath, high fevers or other concerning symptoms.  BP 117/78 mmHg  Pulse 72  Temp(Src) 98 F (36.7 C) (Oral)  Resp 17  SpO2 100%  I personally performed the services described in this documentation, which was scribed in my presence. The recorded information has been reviewed and is accurate.   Thomas Client Zahria Ding, PA-C 01/18/15 2113  Thomas Sprout, MD 01/19/15 2246

## 2015-01-18 NOTE — Discharge Instructions (Signed)
1. Medications: Ibuprofen, Robaxin, usual home medications 2. Treatment: rest, drink plenty of fluids, gentle stretching, incentive spirometry 3. Follow Up: Please followup with your primary doctor in 7 days for discussion of your diagnoses and further evaluation after today's visit; if you do not have a primary care doctor use the resource guide provided to find one; Please return to the ER for high fevers, difficulty breathing or other concerning symptoms    Chest Contusion A chest contusion is a deep bruise on your chest area. Contusions are the result of an injury that caused bleeding under the skin. A chest contusion may involve bruising of the skin, muscles, or ribs. The contusion may turn blue, purple, or yellow. Minor injuries will give you a painless contusion, but more severe contusions may stay painful and swollen for a few weeks. CAUSES  A contusion is usually caused by a blow, trauma, or direct force to an area of the body. SYMPTOMS   Swelling and redness of the injured area.  Discoloration of the injured area.  Tenderness and soreness of the injured area.  Pain. DIAGNOSIS  The diagnosis can be made by taking a history and performing a physical exam. An X-ray, CT scan, or MRI may be needed to determine if there were any associated injuries, such as broken bones (fractures) or internal injuries. TREATMENT  Often, the best treatment for a chest contusion is resting, icing, and applying cold compresses to the injured area. Deep breathing exercises may be recommended to reduce the risk of pneumonia. Over-the-counter medicines may also be recommended for pain control. HOME CARE INSTRUCTIONS   Put ice on the injured area.  Put ice in a plastic bag.  Place a towel between your skin and the bag.  Leave the ice on for 15-20 minutes, 03-04 times a day.  Only take over-the-counter or prescription medicines as directed by your caregiver. Your caregiver may recommend avoiding  anti-inflammatory medicines (aspirin, ibuprofen, and naproxen) for 48 hours because these medicines may increase bruising.  Rest the injured area.  Perform deep-breathing exercises as directed by your caregiver.  Stop smoking if you smoke.  Do not lift objects over 5 pounds (2.3 kg) for 3 days or longer if recommended by your caregiver. SEEK IMMEDIATE MEDICAL CARE IF:   You have increased bruising or swelling.  You have pain that is getting worse.  You have difficulty breathing.  You have dizziness, weakness, or fainting.  You have blood in your urine or stool.  You cough up or vomit blood.  Your swelling or pain is not relieved with medicines. MAKE SURE YOU:   Understand these instructions.  Will watch your condition.  Will get help right away if you are not doing well or get worse. Document Released: 03/18/2001 Document Revised: 03/17/2012 Document Reviewed: 12/15/2011 [facility] Patient Information 2015 [place], [place]. This information is not intended to replace advice given to you by your health care provider. Make sure you discuss any questions you have with your health care provider.    Emergency Department Resource Guide 1) Find a Doctor and Pay Out of Pocket Although you won't have to find out who is covered by your insurance plan, it is a good idea to ask around and get recommendations. You will then need to call the office and see if the doctor you have chosen will accept you as a new patient and what types of options they offer for patients who are self-pay. Some doctors offer discounts or will set up payment  plans for their patients who do not have insurance, but you will need to ask so you aren't surprised when you get to your appointment.  2) Contact Your Local Health Department Not all health departments have doctors that can see patients for sick visits, but many do, so it is worth a call to see if yours does. If you don't know where your local health  department is, you can check in your phone book. The CDC also has a tool to help you locate your state's health department, and many state websites also have listings of all of their local health departments.  3) Find a Walk-in Clinic If your illness is not likely to be very severe or complicated, you may want to try a walk in clinic. These are popping up all over the country in pharmacies, drugstores, and shopping centers. They're usually staffed by nurse practitioners or physician assistants that have been trained to treat common illnesses and complaints. They're usually fairly quick and inexpensive. However, if you have serious medical issues or chronic medical problems, these are probably not your best option.  No Primary Care Doctor: - Call Health Connect at  [phone] - they can help you locate a primary care doctor that  accepts your insurance, provides certain services, etc. - Physician Referral Service- [phone]  Chronic Pain Problems: Organization         Address  Phone   Notes  [facility]  [phone] Patients need to be referred by their primary care doctor.   Medication Assistance: Organization         Address  Phone   Notes  [facility] Medication [facility] [address] [place], [place] [address] [phone] --Must be a resident of [facility] -- Must have NO insurance coverage whatsoever (no Medicaid/ Medicare, etc.) -- The pt. MUST have a primary care doctor that directs their care regularly and follows them in the community   Med[facility]ist  [phone]   [employer]  [phone]    Agencies that provide inexpensive medical care: Organization         Address  Phone   Notes  [facility]  [phone]   [facility]    [phone]   [facility] [address] [place], [place] [address] [phone]   [facility] [address] [place] [phone]   Planned Parenthood    [phone]   [facility]    [phone]   [facility]  [address], [place] Phone:  [phone], Fax:  [phone] Hours of Operation:  9 am - 6 pm, M-F.  Also accepts Medicaid/Medicare and self-pay.  [facility]  [address], [place] Phone: [phone], Fax: [phone]. Hours of Operation:  8:30 am - 5:30 pm, M-F.  Also accepts Medicaid and self-pay.  [facility] [place] [address] [place] Phone: [phone]   [facility] [address] Natasha Bence [place], [place] [phone], Ext. [phone] Mondays & Thursdays: 7-9 AM.  First 15 patients are seen on a first come, first serve basis.    Medicaid-accepting [facility] Providers:  Organization         Address  Phone   Notes  [facility] [address], [place] [phone] Also accepts self-pay  patients.  [facility] [address] Laurell Josephs [place], [place]  [phone]   [facility] [address], [place] [phone]   [facility] [address] [place] [phone]   Renaye Rakers [address], [place]   [phone] Only accepts [place] patients after they have their name applied to their card.   Self-Pay (no insurance) in [facility]:  Organization         Address  Phone   Notes  Sickle Cell Patients, [facility] [address] [place], [place] [phone]   [facility] [address] [place], [place] [phone]   [facility]  [address], [place] [phone]   [facility]/Dr. Osei-Bonsu  [address], [place] or [address], [place] [phone] Phone number for both [place] and  [place] locations is the same.  [facility]gent [facility] and [facility] [address] [place] [phone]   [facility] [address] [place] or [address] [phone]   [facility] [address] [place] [phone], phone; [phone], fax Sees patients 1st and 3rd Saturday of every month.  Must not qualify for public or private insurance (i.e. Medicaid, Medicare, [place] Health Choice, Veterans' Benefits)  Household income should be no more than 200% of the poverty level The clinic cannot treat you if you are pregnant or think you are pregnant  Sexually transmitted diseases are not treated at the clinic.    Dental Care: Organization         Address  Phone  Notes  [facility] Department of [facility] [address] [place], [place] [phone] Accepts children up to age 55 who are enrolled in [place] or [place] Health Choice; pregnant women with a Medicaid card; and children who have applied for Medicaid or [place] Health Choice, but were declined, whose parents can pay a reduced fee at time of service.  [facility]  [address], [place] [phone] Accepts children up to age 12 who are enrolled in [place] or [place] Health Choice; pregnant women with a Medicaid card; and children who have applied for Medicaid or [place] Health Choice, but were declined, whose parents can pay a reduced fee at time of service.  [facility]  [address] [place], [place] [phone] Patients are seen by appointment only. Walk-ins are not accepted. [facility] will see patients 70 years of age and older. Monday - Tuesday (8am-5pm) Most Wednesdays (8:30-5pm) $30 per visit, cash only  [facility] Adult Dental Access PROGRAM  [address], [facility] [phone] Patients are seen by appointment only. Walk-ins are not accepted.  [facility] will see patients 82 years of age and older. One Wednesday Evening (Monthly: Volunteer Based).  $30 per visit, cash only  [employer]  [phone] for adults; Children under age 75, call Graduate Pediatric Dentistry at [phone]. Children aged 21-14, please call [phone] to request a pediatric application.  Dental services are provided in all areas of dental care including fillings, crowns and bridges, complete and partial dentures, implants, gum treatment, root canals, and extractions. Preventive care is also provided. Treatment is provided to  both adults and children. Patients are selected via a lottery and there is often a waiting list.   [facility] [address], [place]  [phone] [email or web address]   [facility] [address], [place], [place] [phone], Ext. [phone] Second and Fourth Thursday of each month, opens at 6:30 AM; Clinic ends at 9 AM.  Patients are seen on a first-come first-served basis, and a limited number are seen during each clinic.   [facility]  [address], [place], [place] [phone]   Eligibility Requirements You must have lived in [place], [place], or [place] counties for at least the last three months.   You cannot be eligible for state or federal sponsored National Cityhealthcare insurance, including CIG[employer]eterans Administration, [place]icaid, or [employer]icare.   You generally cannot be eligible for healthcare insurance through your employer.    How to apply: Eligibility screenings are held every Tuesday and Wednesday afternoon from 1:00 pm until 4:00 pm. You do not need an appointment for the interview!  [facility] [address], [place], [place] [phone]   [facility]  [phone]   [facility]  [phone]   [facility]  [phone]    Behavioral Health Resources in the  Community: Intensive Outpatient Programs Organization         Address  Phone  Notes  [facility] [address], [place], [place] [phone]   [facility] [address], [place], [place] [phone]   [facility] [address], [place], [place]  [phone]   [facility] [address],  [place], [place] [phone] or [phone]   Substance Abuse Resources Organization         Address  Phone  Notes  [facility]  [phone]   [facility]  [phone]   [facility]  [phone]   [facility]  [phone]   Residential & Outpatient Substance Abuse Program  [phone]   Psychological Services Organization         Address  Phone  Notes  [facility]  [phone]- [phone]   [facility]  [phone]- [phone]   [facility] [address], [place] [phone] or [phone]    Mobile Crisis Teams Organization         Address  Phone  Notes  [facility], Mobile Crisis Care Unit  [phone]   [facility]  [address] [place], [place] [phone]   Doristine LocksSharon DeEsch [address] [place] [phone]    Self-Help/Support Groups Organization         Address  Phone             Notes  [facility] [place] - variety of support groups  [phone] Call for more information  Narcotics Anonymous (NA), [facility] [address], [place]  2 meetings at this location   Statisticianesidential Treatment Programs Organization         Address  Phone  Notes  [facility] [address],    [place]  [phone]   [facility]  [address], [place] [phone], [place], [place] [phone]   [facility] [address], [place] [phone] Admissions: 8am-3pm M-F  [facility] [address]    [place], [place] [phone]  [facility] [address] Starling Manns [place], [place] [phone]   [facility] [address],  [place], [place] [phone]   [facility] - Intensive Outpatient [address] Laurell Josephs [address], [place], [place] [phone]   [facility] ([facility] [address]  [place], [place] [phone] or [phone]   [facility] ([facility]) [address] [place], [place] [phone] Accepts Medicaid  [facility] [address],  [place] [phone] Substance Abuse/Addiction Treatment   [facility] Organization         Address  Phone  Notes  [facility]  [phone]   Angie Fava, PhD [address] Ervin Knack [place], [place]   [phone] or [phone]   [facility]   [address] [place], [place] [phone]   [facility] [address], [place], [place] [phone] Insurance/Medicaid/sponsorship through [facility] and Families [address]                                    [place], [place] [phone] Therapy/tele-psych/case  [facility] [address], [place] [phone]    Dr. Lolly Mustache  [phone]   [facility]  [employer] [facility] Dept. 1) [address], [place] 2) [address] [place] 3)  [address], [place] [phone]  [phone]   [facility] Child Abuse Hotline [phone] or [phone] (After Hours)

## 2015-01-18 NOTE — ED Notes (Signed)
Pt reports last week he fell while playing with his kids, since then has been having right rib pain.

## 2015-01-29 ENCOUNTER — Encounter (HOSPITAL_COMMUNITY): Payer: Self-pay | Admitting: Family Medicine

## 2015-01-29 ENCOUNTER — Emergency Department (HOSPITAL_COMMUNITY)
Admission: EM | Admit: 2015-01-29 | Discharge: 2015-01-29 | Disposition: A | Discharge status: Home / Self Care | Payer: Self-pay | Attending: Emergency Medicine | Admitting: Emergency Medicine

## 2015-01-29 ENCOUNTER — Emergency Department (HOSPITAL_COMMUNITY): Payer: Self-pay

## 2015-01-29 DIAGNOSIS — R0781 Pleurodynia: Secondary | ICD-10-CM | POA: Insufficient documentation

## 2015-01-29 DIAGNOSIS — Z791 Long term (current) use of non-steroidal anti-inflammatories (NSAID): Secondary | ICD-10-CM | POA: Insufficient documentation

## 2015-01-29 DIAGNOSIS — Z720 Tobacco use: Secondary | ICD-10-CM | POA: Insufficient documentation

## 2015-01-29 DIAGNOSIS — Z79899 Other long term (current) drug therapy: Secondary | ICD-10-CM | POA: Insufficient documentation

## 2015-01-29 MED ORDER — KETOROLAC TROMETHAMINE 60 MG/2ML IM SOLN
60.0000 mg | Freq: Once | INTRAMUSCULAR | Status: AC
  Administered 2015-01-29: 60 mg via INTRAMUSCULAR
  Filled 2015-01-29: qty 2

## 2015-01-29 MED ORDER — CYCLOBENZAPRINE HCL 10 MG PO TABS: 10.0000 mg | ORAL_TABLET | Freq: Two times a day (BID) | ORAL | Status: DC | PRN

## 2015-01-29 MED ORDER — NAPROXEN 375 MG PO TABS: 375.0000 mg | ORAL_TABLET | Freq: Two times a day (BID) | ORAL | Status: DC

## 2015-01-29 NOTE — ED Notes (Signed)
Patient reports falling approx one month ago landing on right side. Patient states he has had continued pain since the fall. On palpation no deformity noted. Patient reports 7 out of 10 constant pain, increases with coughing. No bruising noted.

## 2015-01-29 NOTE — Discharge Instructions (Signed)

## 2015-01-29 NOTE — ED Notes (Signed)
Pt here for rib pain and SOB after fall over a week ago . sts was seen here and told no broken ribs but the pain continues and he is SOB.

## 2015-01-29 NOTE — ED Provider Notes (Signed)
CSN: [id number]     Arrival date & time 01/29/15  1123 History   None    Chief Complaint  Patient presents with  . Rib Injury  . Shortness of Breath   (Consider location/radiation/quality/duration/timing/severity/associated sxs/prior Treatment) HPI  Patient is a previously healthy 29 year old male presented today for right-sided rib pain. Reports 2 weeks ago he was running and fell striking his right side on a railroad tie in his driveway. He denies hitting his head, loss of consciousness, or injury to other x-rays of the time. He was evaluated in emergency department and found to have no rib fractures and pneumothorax at that time. Patient has continued to have right-sided nonradiating chest pain throughout the 2 weeks. Rated as 6/10.  His alleviated with ibuprofen but is out of her's prescription at this time. Denies any palpitations. Denies any previous DVT/PE.  History reviewed. No pertinent past medical history. History reviewed. No pertinent past surgical history. History reviewed. No pertinent family history. History  Substance Use Topics  . Smoking status: Current Every Day Smoker  . Smokeless tobacco: Not on file  . Alcohol Use: Yes    Review of Systems  Constitutional: Negative for fever and chills.  HENT: Negative for congestion and sore throat.   Eyes: Negative for pain.  Respiratory: Positive for shortness of breath. Negative for cough.        Right sided rib pain   Cardiovascular: Negative for chest pain, palpitations and leg swelling.  Gastrointestinal: Negative for nausea, vomiting, abdominal pain and diarrhea.  Endocrine: Negative.   Genitourinary: Negative for flank pain.  Musculoskeletal: Negative for back pain and neck pain.  Skin: Negative for rash.  Allergic/Immunologic: Negative.   Neurological: Negative for dizziness, syncope and light-headedness.  Psychiatric/Behavioral: Negative for confusion.   Allergies  Review of patient's allergies indicates no  known allergies.  Home Medications   Prior to Admission medications   Medication Sig Start Date End Date Taking? Authorizing Provider  ibuprofen (ADVIL,MOTRIN) 800 MG tablet Take 1 tablet (800 mg total) by mouth 3 (three) times daily. 01/18/15   Hannah Muthersbaugh, PA-C  methocarbamol (ROBAXIN) 500 MG tablet Take 1 tablet (500 mg total) by mouth 2 (two) times daily. 01/18/15   Hannah Muthersbaugh, PA-C   BP 125/90 mmHg  Pulse 61  Temp(Src) 98.5 F (36.9 C) (Oral)  Resp 16  SpO2 99% Physical Exam  Constitutional: He is oriented to person, place, and time. He appears well-developed and well-nourished.  HENT:  Head: Normocephalic and atraumatic.  Eyes: Conjunctivae and EOM are normal. Pupils are equal, round, and reactive to light.  Neck: Normal range of motion. Neck supple.  Cardiovascular: Normal rate, regular rhythm, normal heart sounds and intact distal pulses.   Pulmonary/Chest: Effort normal and breath sounds normal. No respiratory distress. He has no decreased breath sounds. He has no wheezes. He has no rhonchi. Chest wall is not dull to percussion. He exhibits tenderness and bony tenderness. He exhibits no mass, no laceration, no crepitus, no edema, no deformity, no swelling and no retraction.    Abdominal: Soft. Bowel sounds are normal. There is no tenderness.  Musculoskeletal: Normal range of motion.  Neurological: He is alert and oriented to person, place, and time. He has normal reflexes. No cranial nerve deficit.  Skin: Skin is warm and dry.    ED Course  Procedures (including critical care time) Labs Review Labs Reviewed - No data to display  Imaging Review Dg Ribs Unilateral W/chest Right  01/29/2015   CLINICAL  DATA:  Rib injury 1 week ago.  Continued pain.  EXAM: RIGHT RIBS AND CHEST - 3+ VIEW  COMPARISON:  Ribs with chest performed 01/18/2015.  FINDINGS: No fracture or other bone lesions are seen involving the ribs. There is no evidence of pneumothorax or pleural  effusion. Both lungs are clear. Heart size and mediastinal contours are within normal limits. The area of concern is marked with a BB and appears within normal limits.  IMPRESSION: Negative.  No change.   Electronically Signed   By: Elsie Stain M.D.   On: 01/29/2015 12:29     EKG Interpretation None      MDM   Final diagnoses:  Rib pain on right side    Patient is a previously healthy 29 year old male presented today for right-sided rib pain. Reports 2 weeks ago he was running and fell striking his right side on a railroad tie in his driveway  Pain appears musculoskeletal in origin. XR of ribs show no fractures or pneumothorax.  Given Toradol shot in the emergency department with mild symptomatic relief. Patient PERC negative and doubt PE or cardiac etiology at this time. Given naproxen and Flexeril and contact number to establish a primary care physician.  If performed, labs, EKGs, and imaging were reviewed/interpreted by myself and my attending and incorporated into medical decision making.  Discussed pertinent finding with patient or caregiver prior to discharge with no further questions.  Immediate return precautions given and pt or caregiver reports understanding.  Pt care supervised by my attending Dr. Hardie Lora, MD PGY-2  Emergency Medicine   Tery Sanfilippo, MD 01/30/15 1328  Toy Cookey, MD 02/02/15 [phone]

## 2015-07-03 ENCOUNTER — Emergency Department (HOSPITAL_COMMUNITY): Payer: Self-pay

## 2015-07-03 ENCOUNTER — Emergency Department (HOSPITAL_COMMUNITY)
Admission: EM | Admit: 2015-07-03 | Discharge: 2015-07-03 | Disposition: A | Discharge status: Home / Self Care | Payer: Self-pay | Attending: Emergency Medicine | Admitting: Emergency Medicine

## 2015-07-03 ENCOUNTER — Encounter (HOSPITAL_COMMUNITY): Payer: Self-pay | Admitting: Emergency Medicine

## 2015-07-03 DIAGNOSIS — F17210 Nicotine dependence, cigarettes, uncomplicated: Secondary | ICD-10-CM | POA: Insufficient documentation

## 2015-07-03 DIAGNOSIS — K279 Peptic ulcer, site unspecified, unspecified as acute or chronic, without hemorrhage or perforation: Secondary | ICD-10-CM

## 2015-07-03 LAB — LIPASE, BLOOD: LIPASE: 34 U/L (ref 11–51)

## 2015-07-03 LAB — URINALYSIS, ROUTINE W REFLEX MICROSCOPIC
Bilirubin Urine: NEGATIVE
Glucose, UA: NEGATIVE mg/dL
HGB URINE DIPSTICK: NEGATIVE
Ketones, ur: NEGATIVE mg/dL
Leukocytes, UA: NEGATIVE
Nitrite: NEGATIVE
PROTEIN: NEGATIVE mg/dL
Specific Gravity, Urine: 1.023 (ref 1.005–1.030)
pH: 6.5 (ref 5.0–8.0)

## 2015-07-03 LAB — COMPREHENSIVE METABOLIC PANEL
ALT: 17 U/L (ref 17–63)
AST: 31 U/L (ref 15–41)
Albumin: 4.4 g/dL (ref 3.5–5.0)
Alkaline Phosphatase: 81 U/L (ref 38–126)
Anion gap: 10 (ref 5–15)
BUN: 16 mg/dL (ref 6–20)
CHLORIDE: 105 mmol/L (ref 101–111)
CO2: 24 mmol/L (ref 22–32)
CREATININE: 0.8 mg/dL (ref 0.61–1.24)
Calcium: 10 mg/dL (ref 8.9–10.3)
Glucose, Bld: 104 mg/dL — ABNORMAL HIGH (ref 65–99)
POTASSIUM: 4.7 mmol/L (ref 3.5–5.1)
Sodium: 139 mmol/L (ref 135–145)
Total Bilirubin: 1.3 mg/dL — ABNORMAL HIGH (ref 0.3–1.2)
Total Protein: 7.1 g/dL (ref 6.5–8.1)

## 2015-07-03 LAB — CBC
HEMATOCRIT: 43.1 % (ref 39.0–52.0)
Hemoglobin: 14.4 g/dL (ref 13.0–17.0)
MCH: 32.9 pg (ref 26.0–34.0)
MCHC: 33.4 g/dL (ref 30.0–36.0)
MCV: 98.4 fL (ref 78.0–100.0)
PLATELETS: 218 10*3/uL (ref 150–400)
RBC: 4.38 MIL/uL (ref 4.22–5.81)
RDW: 12.7 % (ref 11.5–15.5)
WBC: 10.8 10*3/uL — AB (ref 4.0–10.5)

## 2015-07-03 MED ORDER — ONDANSETRON 4 MG PO TBDP: 4.0000 mg | ORAL_TABLET | Freq: Three times a day (TID) | ORAL | Status: AC | PRN

## 2015-07-03 MED ORDER — SUCRALFATE 1 GM/10ML PO SUSP: 1.0000 g | Freq: Three times a day (TID) | ORAL | Status: AC

## 2015-07-03 MED ORDER — IOHEXOL 300 MG/ML  SOLN
100.0000 mL | Freq: Once | INTRAMUSCULAR | Status: AC | PRN
  Administered 2015-07-03: 100 mL via INTRAVENOUS

## 2015-07-03 MED ORDER — GI COCKTAIL ~~LOC~~
30.0000 mL | Freq: Once | ORAL | Status: AC
  Administered 2015-07-03: 30 mL via ORAL
  Filled 2015-07-03: qty 30

## 2015-07-03 MED ORDER — PANTOPRAZOLE SODIUM 20 MG PO TBEC: 20.0000 mg | DELAYED_RELEASE_TABLET | Freq: Every day | ORAL | Status: AC

## 2015-07-03 MED ORDER — DICYCLOMINE HCL 10 MG PO CAPS
10.0000 mg | ORAL_CAPSULE | Freq: Once | ORAL | Status: AC
  Administered 2015-07-03: 10 mg via ORAL
  Filled 2015-07-03: qty 1

## 2015-07-03 MED ORDER — KETOROLAC TROMETHAMINE 30 MG/ML IJ SOLN
30.0000 mg | Freq: Once | INTRAMUSCULAR | Status: AC
  Administered 2015-07-03: 30 mg via INTRAVENOUS
  Filled 2015-07-03: qty 1

## 2015-07-03 MED ORDER — ONDANSETRON HCL 4 MG/2ML IJ SOLN
4.0000 mg | Freq: Once | INTRAMUSCULAR | Status: AC
  Administered 2015-07-03: 4 mg via INTRAVENOUS
  Filled 2015-07-03: qty 2

## 2015-07-03 MED ORDER — ONDANSETRON HCL 4 MG/2ML IJ SOLN
4.0000 mg | Freq: Once | INTRAMUSCULAR | Status: AC | PRN
  Administered 2015-07-03: 4 mg via INTRAVENOUS
  Filled 2015-07-03: qty 2

## 2015-07-03 MED ORDER — MORPHINE SULFATE (PF) 4 MG/ML IV SOLN
4.0000 mg | Freq: Once | INTRAVENOUS | Status: AC
  Administered 2015-07-03: 4 mg via INTRAVENOUS
  Filled 2015-07-03: qty 1

## 2015-07-03 NOTE — ED Notes (Signed)
Pt from home with c/o LLQ pain x 3 days with diarrhea and nausea.  Denies emesis.  Pt reports hx of the same and was given a GI follow up but unable to see due to no ins.  Pt in NAD, A&O.

## 2015-07-03 NOTE — ED Provider Notes (Signed)
CSN: [id number]     Arrival date & time 07/03/15  4098 History   First MD Initiated Contact with Patient 07/03/15 0745     Chief Complaint  Patient presents with  . Abdominal Pain  . Diarrhea     (Consider location/radiation/quality/duration/timing/severity/associated sxs/prior Treatment) HPI    Patient has a past medical history of abdominal pain. He comes to the emergency room with pain that started on Christmas day 3 days ago, started as cramping with nausea, vomiting and diarrhea but has progressed to localize of the left lower quadrant peri umbilical it has worsened in severity. He now reports significant pressure and pain. Drawing his knees up to his chest improves the pain, laying flat makes it worse. He denies having any fevers and has had one episode of vomiting the past few days. He denies any blood per rectum, coffee ground emesis or brb in vomit.  PCP: No PCP Per Patient  ROS: The patient denies diaphoresis, fever, headache, weakness (general or focal), confusion, change of vision,  dysphagia, aphagia, shortness of breath,   lower extremity swelling, rash, neck pain, chest pain   History reviewed. No pertinent past medical history. History reviewed. No pertinent past surgical history. History reviewed. No pertinent family history. Social History  Substance Use Topics  . Smoking status: Current Every Day Smoker -- 0.50 packs/day    Types: Cigarettes  . Smokeless tobacco: Never Used  . Alcohol Use: Yes     Comment: "a 12 pack and a 1/5 of liquor"    Review of Systems  Review of Systems All other systems negative except as documented in the HPI. All pertinent positives and negatives as reviewed in the HPI.   Allergies  Review of patient's allergies indicates no known allergies.  Home Medications   Prior to Admission medications   Medication Sig Start Date End Date Taking? Authorizing Provider  acetaminophen (TYLENOL) 500 MG tablet Take 1,000 mg by mouth every 4  (four) hours as needed (pain).   Yes Historical Provider, MD  ondansetron (ZOFRAN ODT) 4 MG disintegrating tablet Take 1 tablet (4 mg total) by mouth every 8 (eight) hours as needed for nausea or vomiting. 07/03/15   Hurshell Dino Neva Seat, PA-C  pantoprazole (PROTONIX) 20 MG tablet Take 1 tablet (20 mg total) by mouth daily. 07/03/15   Shivangi Lutz Neva Seat, PA-C  sucralfate (CARAFATE) 1 GM/10ML suspension Take 10 mLs (1 g total) by mouth 4 (four) times daily -  with meals and at bedtime. 07/03/15   Martita Brumm Neva Seat, PA-C   BP 131/92 mmHg  Pulse 91  Temp(Src) 98.3 F (36.8 C) (Oral)  Ht  (1.676 m)  Wt 63.504 kg  BMI 22.61 kg/m2  SpO2 99% Physical Exam  Constitutional: He appears well-developed and well-nourished. No distress.  HENT:  Head: Normocephalic and atraumatic.  Right Ear: Tympanic membrane and ear canal normal.  Left Ear: Tympanic membrane and ear canal normal.  Nose: Nose normal.  Mouth/Throat: Uvula is midline, oropharynx is clear and moist and mucous membranes are normal.  Eyes: Pupils are equal, round, and reactive to light.  Neck: Normal range of motion. Neck supple.  Cardiovascular: Normal rate and regular rhythm.   Pulmonary/Chest: Effort normal.  Abdominal: Soft. Bowel sounds are normal. He exhibits no distension. There is tenderness in the periumbilical area and left lower quadrant. There is guarding. There is no rigidity, no rebound and no CVA tenderness.  No signs of abdominal distention  Musculoskeletal:  No LE swelling  Neurological: He is alert.  Acting at baseline  Skin: Skin is warm and dry. No rash noted.  Nursing note and vitals reviewed.   ED Course  Procedures (including critical care time) Labs Review Labs Reviewed  COMPREHENSIVE METABOLIC PANEL - Abnormal; Notable for the following:    Glucose, Bld 104 (*)    Total Bilirubin 1.3 (*)    All other components within normal limits  CBC - Abnormal; Notable for the following:    WBC 10.8 (*)    All other  components within normal limits  LIPASE, BLOOD  URINALYSIS, ROUTINE W REFLEX MICROSCOPIC (NOT AT [facility])    Imaging Review Ct Abdomen Pelvis W Contrast  07/03/2015  CLINICAL DATA:  Periumbilical left lower quadrant abdominal pain, nausea, and vomiting since 06/30/2015. EXAM: CT ABDOMEN AND PELVIS WITH CONTRAST TECHNIQUE: Multidetector CT imaging of the abdomen and pelvis was performed using the standard protocol following bolus administration of intravenous contrast. CONTRAST:  OMNIPAQUE IOHEXOL 300 MG/ML  SOLN COMPARISON:  03/12/2013 FINDINGS: The visualized lung bases are free of consolidation or effusion. The liver, gallbladder, spleen, adrenal glands, and pancreas have an unremarkable enhanced appearance. There are sub 5 mm nonobstructing renal calculi bilaterally, increased in number from the prior study. A 10 mm left renal cyst is unchanged. The no ureteral calculi or ureteral dilatation is seen. There is no evidence of bowel obstruction or inflammation. The appendix is unremarkable. Bladder is unremarkable. No free fluid or enlarged lymph nodes are identified. Old lateral right sixth through eighth rib fractures are noted. 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 Chronic deformity of the anterosuperior L5 vertebral body is unchanged. IMPRESSION: 1. No acute abnormality identified in the abdomen or pelvis. 2. Nonobstructing bilateral nephrolithiasis. Electronically Signed   By: Sebastian Ache M.D.   On: 07/03/2015 09:06   I have personally reviewed and evaluated these images and lab results as part of my medical decision-making.   EKG Interpretation None      MDM   Final diagnoses:  Peptic ulcer   9: 15 am CMP, Lipase and urinalysis are WNL. Mildly elevated WBC at 10.8. Normal CT scan of the abdomen/pelv with contrast. Will continue to provider pain control as pt still guards on exam.  10: 53 am - The patient was given Bentyl, Toradol, morphine and Zofran. He did not  have significant relief with these medications. He was then given a dose of the GI cocktail and reports significant improvement of his symptoms  Therefore I will discharge him with Carafate, Protonix and Zofran. He's been given referral to the gastroenterologist if he is able to see them that would be first choice otherwise I've given him referral to [facility].  Medications  ondansetron (ZOFRAN) injection 4 mg (4 mg Intravenous Given 07/03/15 0812)  morphine 4 MG/ML injection 4 mg (4 mg Intravenous Given 07/03/15 0856)  iohexol (OMNIPAQUE) 300 MG/ML solution 100 mL (100 mLs Intravenous Contrast Given 07/03/15 0844)  dicyclomine (BENTYL) capsule 10 mg (10 mg Oral Given 07/03/15 0916)  ondansetron (ZOFRAN) injection 4 mg (4 mg Intravenous Given 07/03/15 0916)  ketorolac (TORADOL) 30 MG/ML injection 30 mg (30 mg Intravenous Given 07/03/15 0957)  gi cocktail (Maalox,Lidocaine,Donnatal) (30 mLs Oral Given 07/03/15 1038)     I feel the patient has had an appropriate workup for their chief complaint at this time and likelihood of emergent condition existing is low. Discussed s/sx that warrant return to the ED.  Filed Vitals:   07/03/15 1000 07/03/15  1030  BP: 121/87 131/92  Pulse: 95 91  Temp:       Marlon Peliffany Arsen Mangione, PA-C 07/03/15 1054  Cathren LaineKevin Steinl, MD 07/03/15 [phone]

## 2015-07-03 NOTE — Discharge Instructions (Signed)
Food Choices for Peptic Ulcer Disease °When you have peptic ulcer disease, the foods you eat and your eating habits are very important. Choosing the right foods can help ease the discomfort of peptic ulcer disease. °WHAT GENERAL GUIDELINES DO I NEED TO FOLLOW? °· Choose fruits, vegetables, whole grains, and low-fat meat, fish, and poultry.   °· Keep a food diary to identify foods that cause symptoms. °· Avoid foods that cause irritation or pain. These may be different for different people. °· Eat frequent small meals instead of three large meals each day. The pain may be worse when your stomach is empty.  °· Avoid eating close to bedtime. °WHAT FOODS ARE NOT RECOMMENDED? °The following are some foods and drinks that may worsen your symptoms: °· Black, white, and red pepper. °· Hot sauce. °· Chili peppers. °· Chili powder. °· Chocolate and cocoa.    °· Alcohol. °· Tea, coffee, and cola (regular and decaffeinated). °The items listed above may not be a complete list of foods and beverages to avoid. Contact your dietitian for more information. °  °This information is not intended to replace advice given to you by your health care provider. Make sure you discuss any questions you have with your health care provider. °  °Document Released: 09/15/2011 Document Revised: 06/28/2013 Document Reviewed: 04/27/2013 °Elsevier Interactive Patient Education ©2016 [employer] °Peptic Ulcer °A peptic ulcer is a sore in the lining of your esophagus (esophageal ulcer), stomach (gastric ulcer), or in the first part of your small intestine (duodenal ulcer). The ulcer causes erosion into the deeper tissue. °CAUSES  °Normally, the lining of the stomach and the small intestine protects itself from the acid that digests food. The protective lining can be damaged by: °· An infection caused by a bacterium called Helicobacter pylori (H. pylori). °· Regular use of nonsteroidal anti-inflammatory drugs (NSAIDs), such as ibuprofen or  aspirin. °· Smoking tobacco. °Other risk factors include being older than 50, drinking alcohol excessively, and having a family history of ulcer disease.  °SYMPTOMS  °· Burning pain or gnawing in the area between the chest and the belly button. °· Heartburn. °· Nausea and vomiting. °· Bloating. °The pain can be worse on an empty stomach and at night. If the ulcer results in bleeding, it can cause: °· Black, tarry stools. °· Vomiting of bright red blood. °· Vomiting of coffee-ground-looking materials. °DIAGNOSIS  °A diagnosis is usually made based upon your history and an exam. Other tests and procedures may be performed to find the cause of the ulcer. Finding a cause will help determine the best treatment. Tests and procedures may include: °· Blood tests, stool tests, or breath tests to check for the bacterium H. pylori. °· An upper gastrointestinal (GI) series of the esophagus, stomach, and small intestine. °· An endoscopy to examine the esophagus, stomach, and small intestine. °· A biopsy. °TREATMENT  °Treatment may include: °· Eliminating the cause of the ulcer, such as smoking, NSAIDs, or alcohol. °· Medicines to reduce the amount of acid in your digestive tract. °· Antibiotic medicines if the ulcer is caused by the H. pylori bacterium. °· An upper endoscopy to treat a bleeding ulcer. °· Surgery if the bleeding is severe or if the ulcer created a hole somewhere in the digestive system. °HOME CARE INSTRUCTIONS  °· Avoid tobacco, alcohol, and caffeine. Smoking can increase the acid in the stomach, and continued smoking will impair the healing of ulcers. °· Avoid foods and drinks that seem to cause discomfort or aggravate your ulcer. °·   Only take medicines as directed by your caregiver. Do not substitute over-the-counter medicines for prescription medicines without talking to your caregiver. °· Keep any follow-up appointments and tests as directed. °SEEK MEDICAL CARE IF:  °· Your do not improve within 7 days of  starting treatment. °· You have ongoing indigestion or heartburn. °SEEK IMMEDIATE MEDICAL CARE IF:  °· You have sudden, sharp, or persistent abdominal pain. °· You have bloody or dark black, tarry stools. °· You vomit blood or vomit that looks like coffee grounds. °· You become light-headed, weak, or feel faint. °· You become sweaty or clammy. °MAKE SURE YOU:  °· Understand these instructions. °· Will watch your condition. °· Will get help right away if you are not doing well or get worse. °  °This information is not intended to replace advice given to you by your health care provider. Make sure you discuss any questions you have with your health care provider. °  °Document Released: 06/20/2000 Document Revised: 07/14/2014 Document Reviewed: 01/21/2012 °Elsevier Interactive Patient Education ©2016 [employer] ° °

## 2015-07-03 NOTE — ED Notes (Signed)
Patient transported to CT 

## 2015-08-07 ENCOUNTER — Encounter (HOSPITAL_COMMUNITY): Payer: Self-pay | Admitting: Emergency Medicine

## 2015-08-07 ENCOUNTER — Emergency Department (HOSPITAL_COMMUNITY)
Admission: EM | Admit: 2015-08-07 | Discharge: 2015-08-07 | Disposition: A | Discharge status: Home / Self Care | Payer: Self-pay | Attending: Emergency Medicine | Admitting: Emergency Medicine

## 2015-08-07 DIAGNOSIS — Y9289 Other specified places as the place of occurrence of the external cause: Secondary | ICD-10-CM | POA: Insufficient documentation

## 2015-08-07 DIAGNOSIS — M546 Pain in thoracic spine: Secondary | ICD-10-CM

## 2015-08-07 DIAGNOSIS — X58XXXA Exposure to other specified factors, initial encounter: Secondary | ICD-10-CM | POA: Insufficient documentation

## 2015-08-07 DIAGNOSIS — Y998 Other external cause status: Secondary | ICD-10-CM | POA: Insufficient documentation

## 2015-08-07 DIAGNOSIS — Y9389 Activity, other specified: Secondary | ICD-10-CM | POA: Insufficient documentation

## 2015-08-07 DIAGNOSIS — Z79899 Other long term (current) drug therapy: Secondary | ICD-10-CM | POA: Insufficient documentation

## 2015-08-07 DIAGNOSIS — S29002A Unspecified injury of muscle and tendon of back wall of thorax, initial encounter: Secondary | ICD-10-CM | POA: Insufficient documentation

## 2015-08-07 DIAGNOSIS — F17210 Nicotine dependence, cigarettes, uncomplicated: Secondary | ICD-10-CM | POA: Insufficient documentation

## 2015-08-07 MED ORDER — CYCLOBENZAPRINE HCL 10 MG PO TABS: 10.0000 mg | ORAL_TABLET | Freq: Two times a day (BID) | ORAL | Status: AC | PRN

## 2015-08-07 MED ORDER — CYCLOBENZAPRINE HCL 10 MG PO TABS
10.0000 mg | ORAL_TABLET | Freq: Once | ORAL | Status: AC
  Administered 2015-08-07: 10 mg via ORAL
  Filled 2015-08-07: qty 1

## 2015-08-07 MED ORDER — IBUPROFEN 800 MG PO TABS: 800.0000 mg | ORAL_TABLET | Freq: Three times a day (TID) | ORAL | Status: AC

## 2015-08-07 MED ORDER — KETOROLAC TROMETHAMINE 60 MG/2ML IM SOLN
60.0000 mg | Freq: Once | INTRAMUSCULAR | Status: AC
  Administered 2015-08-07: 60 mg via INTRAMUSCULAR
  Filled 2015-08-07: qty 2

## 2015-08-07 NOTE — ED Notes (Signed)
States was playing with daughter, bent to catch her, sudden right mid back pain, worsened over past 2 days. Pain increased with movement.

## 2015-08-07 NOTE — Discharge Instructions (Signed)
Take your medications as prescribed. I also recommend applying ice to affected area for 15-20 minutes 3-4 times daily for the next few days and then he may begin applying heat. Please follow up with a primary care provider from the Resource Guide provided below in 5 days. Please return to the Emergency Department if symptoms worsen or new onset of fever, numbness, tingling, weakness, chest pain, shortness of breath.   Emergency Department Resource Guide 1) Find a Doctor and Pay Out of Pocket Although you won't have to find out who is covered by your insurance plan, it is a good idea to ask around and get recommendations. You will then need to call the office and see if the doctor you have chosen will accept you as a new patient and what types of options they offer for patients who are self-pay. Some doctors offer discounts or will set up payment plans for their patients who do not have insurance, but you will need to ask so you aren't surprised when you get to your appointment.  2) Contact Your Local Health Department Not all health departments have doctors that can see patients for sick visits, but many do, so it is worth a call to see if yours does. If you don't know where your local health department is, you can check in your phone book. The CDC also has a tool to help you locate your state's health department, and many state websites also have listings of all of their local health departments.  3) Find a Walk-in Clinic If your illness is not likely to be very severe or complicated, you may want to try a walk in clinic. These are popping up all over the country in pharmacies, drugstores, and shopping centers. They're usually staffed by nurse practitioners or physician assistants that have been trained to treat common illnesses and complaints. They're usually fairly quick and inexpensive. However, if you have serious medical issues or chronic medical problems, these are probably not your best  option.  No Primary Care Doctor: - Call Health Connect at  [phone] - they can help you locate a primary care doctor that  accepts your insurance, provides certain services, etc. - Physician Referral Service- [phone]  Chronic Pain Problems: Organization         Address  Phone   Notes  [facility]  [phone] Patients need to be referred by their primary care doctor.   Medication Assistance: Organization         Address  Phone   Notes  [facility] Med[facility] [address] [place] [place], [place] [address] [phone] --Must be a resident of [facility] -- Must have NO insurance coverage whatsoever (no Medicaid/ Medicare, etc.) -- The pt. MUST have a primary care doctor that directs their care regularly and follows them in the community   [facility]  [phone]   [employer]  [phone]    Agencies that provide inexpensive medical care: Organization         Address  Phone   Notes  [facility]  [phone]   [facility]    [phone]   [facility] [address] [place], [place] [address] [phone]   [facility] [address] [place]. [address], [place] [phone]   [facility]    [phone]   [facility]    [phone]   [facility] ° [address], [place] Phone:  ([phone], Fax:  [phone] Hours of Operation:  9 am - 6 pm, M-F.  Also accepts Medicaid/Medicare and self-pay.  °[facility] ° [address], [place] Phone: [phone], Fax: [phone]. Hours of Operation:  8:30 am - 5:30 pm, M-F.  Also accepts Medicaid and self-pay.  °[facility] [place] [address], [place] Phone: ([phone]) [phone]   °[facility] [address], [place], [place] ([phone], Ext. 123 Mondays & Thursdays: 7-9 AM.  First 15  patients are seen on a first come, first serve basis. °  ° °Medicaid-accepting [place] Providers: ° °Organization         Address  Phone   Notes  °[facility] [address], [place] [phone] Also accepts self-pay patients.  °[facility] [address], [place] ° ([phone]) [phone]   °[facility] [address], [place] ([phone]) [phone]   °[facility] [address] [place] [address], [place] ([phone]) [phone]   °Veita Bland [address], [place]  ° ([phone]) [phone] Only accepts [place] Access Medicaid patients after they have their name applied to their card.  ° °Self-Pay (no insurance) in [place]: ° °Organization         Address  Phone   Notes  °Sickle Cell Patients, [facility] [address], [place] ([phone]) [phone]   °[facility] [address], [place] ([phone]) [phone]   °[facility] ° [address], [place] ([phone]) [phone]   °[facility]/Dr. Osei-Bonsu ° [address] [place] [address], [place] or [address], [place] [phone]) [phone] Phone number for both [place] and [place] locations is the same.  °[facility] [address], [place] ([phone]) [phone]   °[facility] [address] [place] [address], [place] or [address] ([phone]) [phone] °([phone]) [phone]   °[facility] [address], [place] ([phone]) [phone], phone; ([phone]) [phone], fax Sees patients 1st and 3rd Saturday of every month.  Must not qualify for public or private insurance (i.e. Medicaid, Medicare, [place] Health Choice, Veterans' Benefits) • Household income should be no more than 200% of the poverty level •The clinic cannot treat you if you are pregnant or think you are pregnant • Sexually transmitted diseases are not treated at the clinic.  ° ° °Dental  Care: °Organization         Address  Phone  Notes  °[place] [facility] [address], [place] ([phone]) [phone] Accepts children up to age 21 who are enrolled in Medicaid or [place] Health Choice; pregnant women with a Medicaid card; and children who have applied for Medicaid or [place] Health Choice, but were declined, whose parents can pay a reduced fee at time of service.  °[place] [facility] [place]  [address], [place] ([phone]) [phone] Accepts children up to age 21 who are enrolled in Medicaid or [place] Health Choice; pregnant women with a Medicaid card; and children who have applied for Medicaid or [place] Health Choice, but were declined, whose parents can pay a reduced fee at time of service.  °[facility] ° [address], [place] (  [phone]) Z1729269 Patients are seen by appointment only. Walk-ins are not accepted. [employer] will see patients 71 years of age and older. Monday - Tuesday (8am-5pm) Most Wednesdays (8:30-5pm) $30 per visit, cash only  [facility]  [address], [facility] [phone] Patients are seen by appointment only. Walk-ins are not accepted. [place] will see patients 37 years of age and older. One Wednesday Evening (Monthly: Volunteer Based).  $30 per visit, cash only  [place]  [phone] for adults; Children under age 71, call Graduate Pediatric Dentistry at [phone]. Children aged 47-14, please call [phone] to request a pediatric application.  Dental services are provided in all areas of dental care including fillings, crowns and bridges, complete and partial dentures, implants, gum treatment, root canals, and extractions. Preventive care is also provided. Treatment is provided to both adults and children. Patients are selected via a lottery and there is often a waiting list.   [facility] [address] [place]  [phone] [email or web address]   [facility] [address] [place], [place] [phone], Ext. 123 Second and Fourth Thursday of each month, opens at 6:30 AM; Clinic ends at 9 AM.  Patients are seen on a first-come first-served basis, and a limited number are seen during each clinic.   [facility]  [address] [place] Danker [place], [place] [phone]   Eligibility Requirements You must have lived in [place], [place], or [place] counties for at least the last three months.   You cannot be eligible for state or federal sponsored [employer], including [employer], [place], or [employer].   You generally cannot be eligible for healthcare insurance through your employer.    How to apply: Eligibility screenings are held every Tuesday and Wednesday afternoon from 1:00 pm until 4:00 pm. You do not need an appointment for the interview!  [facility] [address] [place], [place]   [place]  [place] Department  [place]  [phone]    Behavioral Health Resources in the Community: Intensive Outpatient Programs Organization         Address  Phone  Notes  [facility]. [address], [place], [place] [phone]   [facility] [address] [place], [place]   [facility]: [facility] [address], [place], [place]   [place] [address]  [place], [place] or [phone]   Substance Abuse Resources Organization         Address  Phone  Notes  [facility]  205-726-1914   [place]  ([phone]) [phone]   [facility] [place]   Chinita Pester  [phone]   Residential & Outpatient Substance Abuse Program  [phone]) [phone]    Psychological Services Organization         Address  Phone  Notes  [facility] [place]  [place]  4128351847   [place] [address], [place] or (340)[phone]    Mobile Crisis Teams Organization         Address  Phone  Notes  [facility], Mobile Crisis Care Unit  [phone]   [facility]  [address] [place], [place]   [place] [address] [place] [phone]    Self-Help/Support Groups Organization         Address  Phone             Notes  [facility] [place] - variety of support groups  336- I7437963 Call for more information  Narcotics Anonymous (NA), [facility] [address], [employer] [place]  2 meetings at this location   Statistician         Address  Phone  Notes  [facility] [address],    [place]  [phone]   [facility]  [address], [place] [address], [place], [place] [phone]   [facility] [address] [place], [place] [phone] Admissions: 8am-3pm M-F  [facility] [address],    [place], [place] [phone]   [facility] [address] [place], [place], [place] [phone]   [facility] [address]  [place], [place] [phone]   [facility] - Intensive Outpatient [address], [place], [place] [phone]   [facility] ([facility] [address] [place]  [place], [place] [phone] or [phone]   [facility] ([facility]) [address] [place], [place] [phone] Accepts Medicaid  [facility] [place] [facility] [address]  [place] [phone] Substance Abuse/Addiction Treatment   [facility] Organization         Address  Phone  Notes  [facility]  [phone]   Angie Fava, PhD [address] Knack [place], [place]   [phone] or [phone]   [facility]   [address] [place], [place] [phone]   [facility] [address], [place], [place] [phone] Insurance/Medicaid/sponsorship through [facility] and Families [address]                                    [place], [place] [phone] Therapy/tele-psych/case  [facility] [address], [place] [phone]    Dr. Lolly Mustache  [phone]   [facility]  [facility] Dept. 1) [address] [place] 2) [address] [place] 3)  [address], [place] [phone]  [phone]   [facility] Child Abuse Hotline 681-207-0857 or [phone])[phone] (After Hours)

## 2015-08-07 NOTE — ED Provider Notes (Signed)
History  By signing my name below, I, Karle Plumber, attest that this documentation has been prepared under the direction and in the presence of Melburn Hake, [place]. Electronically Signed: Karle Plumber, ED Scribe. 08/07/2015. 10:01 AM.  Chief Complaint  Patient presents with  . Back Pain   The history is provided by the patient and medical records. No language interpreter was used.    HPI Comments:  Thomas Oneal is a 30 y.o. male who presents to the Emergency Department complaining of moderate non-radiating, mid right-sided, sharp back pain that began yesterday but has been gradually worsening since. Pt states he bent over and picked up his 10-year-old daughter with his right arm when the pain began. Pt also reports lifting heavy things at work. He has not taken anything for pain. Moving and coughing increase the pain. He denies alleviating factors. He denies any pain in his extremities, CP, SOB, numbness tingling or weakness of any extremity.   No past medical history on file. No past surgical history on file. No family history on file. Social History  Substance Use Topics  . Smoking status: Current Every Day Smoker -- 0.50 packs/day    Types: Cigarettes  . Smokeless tobacco: Never Used  . Alcohol Use: Yes     Comment: "a 12 pack and a 1/5 of liquor"    Review of Systems  Musculoskeletal: Positive for back pain.  All other systems reviewed and are negative.   Allergies  Review of patient's allergies indicates no known allergies.  Home Medications   Prior to Admission medications   Medication Sig Start Date End Date Taking? Authorizing Provider  acetaminophen (TYLENOL) 500 MG tablet Take 1,000 mg by mouth every 4 (four) hours as needed (pain).    Historical Provider, MD  cyclobenzaprine (FLEXERIL) 10 MG tablet Take 1 tablet (10 mg total) by mouth 2 (two) times daily as needed for muscle spasms. 08/07/15   Barrett Henle, PA-C  ibuprofen (ADVIL,MOTRIN) 800 MG  tablet Take 1 tablet (800 mg total) by mouth 3 (three) times daily. 08/07/15   Barrett Henle, PA-C  ondansetron (ZOFRAN ODT) 4 MG disintegrating tablet Take 1 tablet (4 mg total) by mouth every 8 (eight) hours as needed for nausea or vomiting. 07/03/15   Tiffany Neva Seat, PA-C  pantoprazole (PROTONIX) 20 MG tablet Take 1 tablet (20 mg total) by mouth daily. 07/03/15   Tiffany Neva Seat, PA-C  sucralfate (CARAFATE) 1 GM/10ML suspension Take 10 mLs (1 g total) by mouth 4 (four) times daily -  with meals and at bedtime. 07/03/15   Marlon Pel, PA-C   Triage Vitals: BP 143/96 mmHg  Pulse 79  Temp(Src) 97.9 F (36.6 C) (Oral)  Resp 16  Ht  (1.676 m)  Wt 150 lb (68.04 kg)  BMI 24.22 kg/m2  SpO2 100% Physical Exam  Constitutional: He is oriented to person, place, and time. He appears well-developed and well-nourished.  HENT:  Head: Normocephalic and atraumatic.  Eyes: EOM are normal.  Neck: Normal range of motion.  Cardiovascular: Normal rate, regular rhythm and normal heart sounds.  Exam reveals no gallop and no friction rub.   No murmur heard. 2+ radial pulses. Cap refill less than 2 seconds.  Pulmonary/Chest: Effort normal and breath sounds normal. No respiratory distress. He has no wheezes. He has no rales.  Musculoskeletal: Normal range of motion. He exhibits tenderness.  No C, T, L midline tenderness. Mild tenderness over right rhomboids and upper thoracic paraspinal muscles. Full ROM of neck, back  and BUE.  Neurological: He is alert and oriented to person, place, and time.  5/5 strength. Sensations intact. Pt able to stand and ambulate without assistance.  Skin: Skin is warm and dry.  Psychiatric: He has a normal mood and affect. His behavior is normal.  Nursing note and vitals reviewed.   ED Course  Procedures (including critical care time) DIAGNOSTIC STUDIES: Oxygen Saturation is 100% on RA, normal by my interpretation.   COORDINATION OF CARE: 9:58 AM- Will order  injection of Toradol and muscle relaxer prior to discharge. Will prescribe pain medication and muscle relaxer. Advised pt to rest and apply ice compresses. Pt verbalizes understanding and agrees to plan.  Medications  ketorolac (TORADOL) injection 60 mg (not administered)  cyclobenzaprine (FLEXERIL) tablet 10 mg (not administered)   Filed Vitals:   08/07/15 0932  BP: 143/96  Pulse: 79  Temp: 97.9 F (36.6 C)  Resp: 16     MDM   Final diagnoses:  Right-sided thoracic back pain    Patient presents with pain to his right upper back that is worse with movement. Reports the pain started after picking up his daughter and notes it worsened with heavy lifting that he does at work. Denies any other recent fall, trauma, injury. VSS. Exam revealed mild tenderness over right rhomboids and upper thoracic paraspinal muscles, full range of motion of neck back in bilateral upper extremities. Bilateral upper extremities neurovascularly intact. I suspect symptoms are likely due to muscle strain/spasm. Plan to discharge patient home with NSAIDs and muscle relaxant and discussed symptomatic treatment. Patient given resource guide to follow up with PCP.  Evaluation does not show pathology requring ongoing emergent intervention or admission. Pt is hemodynamically stable and mentating appropriately. Discussed findings/results and plan with patient/guardian, who agrees with plan. All questions answered. Return precautions discussed and outpatient follow up given.    I personally performed the services described in this documentation, which was scribed in my presence. The recorded information has been reviewed and is accurate.     Satira Sark [place], [place] 08/07/15 1009  Tilden Fossa, MD 08/08/15 [phone]

## 2015-08-12 ENCOUNTER — Emergency Department (HOSPITAL_COMMUNITY)
Admission: EM | Admit: 2015-08-12 | Discharge: 2015-08-12 | Disposition: A | Discharge status: Home / Self Care | Payer: Self-pay | Attending: Emergency Medicine | Admitting: Emergency Medicine

## 2015-08-12 ENCOUNTER — Encounter (HOSPITAL_COMMUNITY): Payer: Self-pay | Admitting: Nurse Practitioner

## 2015-08-12 DIAGNOSIS — F17210 Nicotine dependence, cigarettes, uncomplicated: Secondary | ICD-10-CM | POA: Insufficient documentation

## 2015-08-12 DIAGNOSIS — Z79899 Other long term (current) drug therapy: Secondary | ICD-10-CM | POA: Insufficient documentation

## 2015-08-12 DIAGNOSIS — J020 Streptococcal pharyngitis: Secondary | ICD-10-CM | POA: Insufficient documentation

## 2015-08-12 DIAGNOSIS — Z791 Long term (current) use of non-steroidal anti-inflammatories (NSAID): Secondary | ICD-10-CM | POA: Insufficient documentation

## 2015-08-12 LAB — RAPID STREP SCREEN (MED CTR MEBANE ONLY): STREPTOCOCCUS, GROUP A SCREEN (DIRECT): POSITIVE — AB

## 2015-08-12 MED ORDER — HYDROCODONE-ACETAMINOPHEN 7.5-325 MG/15ML PO SOLN: 15.0000 mL | Freq: Four times a day (QID) | ORAL | Status: AC | PRN

## 2015-08-12 MED ORDER — DEXAMETHASONE SODIUM PHOSPHATE 10 MG/ML IJ SOLN
10.0000 mg | Freq: Once | INTRAMUSCULAR | Status: AC
  Administered 2015-08-12: 10 mg via INTRAMUSCULAR
  Filled 2015-08-12: qty 1

## 2015-08-12 MED ORDER — PENICILLIN G BENZATHINE 1200000 UNIT/2ML IM SUSP
1.2000 10*6.[IU] | Freq: Once | INTRAMUSCULAR | Status: AC
  Administered 2015-08-12: 1.2 10*6.[IU] via INTRAMUSCULAR
  Filled 2015-08-12: qty 2

## 2015-08-12 NOTE — ED Provider Notes (Signed)
CSN: [id number]     Arrival date & time 08/12/15  1108 History   None    Chief Complaint  Patient presents with  . Sore Throat     (Consider location/radiation/quality/duration/timing/severity/associated sxs/prior Treatment) Patient is a 30 y.o. male presenting with pharyngitis.  Sore Throat Associated symptoms include chills, a fever, myalgias and a sore throat. Pertinent negatives include no coughing.  30 y.o. male with sore throat, myalgias, swollen glands, headache and fever for 3 days. No history of rheumatic fever.Daughter tested pos for Strep 2 days ago. P[ain with swallowing . Tolerating secretions.     History reviewed. No pertinent past medical history. History reviewed. No pertinent past surgical history. History reviewed. No pertinent family history. Social History  Substance Use Topics  . Smoking status: Current Every Day Smoker -- 0.50 packs/day    Types: Cigarettes  . Smokeless tobacco: Never Used  . Alcohol Use: Yes     Comment: "a 12 pack and a 1/5 of liquor"    Review of Systems  Constitutional: Positive for fever and chills.  HENT: Positive for sore throat and trouble swallowing. Negative for dental problem and voice change.   Respiratory: Negative for cough.   Musculoskeletal: Positive for myalgias.    .  Allergies  Review of patient's allergies indicates no known allergies.  Home Medications   Prior to Admission medications   Medication Sig Start Date End Date Taking? Authorizing Provider  acetaminophen (TYLENOL) 500 MG tablet Take 1,000 mg by mouth every 4 (four) hours as needed (pain).    Historical Provider, MD  cyclobenzaprine (FLEXERIL) 10 MG tablet Take 1 tablet (10 mg total) by mouth 2 (two) times daily as needed for muscle spasms. 08/07/15   Barrett Henle, PA-C  HYDROcodone-acetaminophen (HYCET) 7.5-325 mg/15 ml solution Take 15 mLs by mouth 4 (four) times daily as needed for moderate pain. 08/12/15 08/11/16  Arthor Captain, PA-C   ibuprofen (ADVIL,MOTRIN) 800 MG tablet Take 1 tablet (800 mg total) by mouth 3 (three) times daily. 08/07/15   Barrett Henle, PA-C  ondansetron (ZOFRAN ODT) 4 MG disintegrating tablet Take 1 tablet (4 mg total) by mouth every 8 (eight) hours as needed for nausea or vomiting. 07/03/15   Tiffany Neva Seat, PA-C  pantoprazole (PROTONIX) 20 MG tablet Take 1 tablet (20 mg total) by mouth daily. 07/03/15   Tiffany Neva Seat, PA-C  sucralfate (CARAFATE) 1 GM/10ML suspension Take 10 mLs (1 g total) by mouth 4 (four) times daily -  with meals and at bedtime. 07/03/15   Tiffany Neva Seat, PA-C   BP 125/77 mmHg  Pulse 118  Temp(Src) 98.1 F (36.7 C) (Oral)  Resp 20  SpO2 98% Physical Exam OBJECTIVE:  Vitals as noted above. Appears alert, well appearing, and in no distress. Ears: bilateral TM's and external ear canals normal Oropharynx: tonsils hypertrophied with exudate and throat culture obtained Neck: adenopathy noted tonsilar Lungs: clear to auscultation, no wheezes, rales or rhonchi, symmetric air entry Rapid Strep test is positive ED Course  Procedures (including critical care time) Labs Review Labs Reviewed  RAPID STREP SCREEN (NOT AT [facility]) - Abnormal; Notable for the following:    Streptococcus, Group A Screen (Direct) POSITIVE (*)    All other components within normal limits    Imaging Review No results found. I have personally reviewed and evaluated these images and lab results as part of my medical decision-making.   EKG Interpretation None      MDM   Final diagnoses:  Strep pharyngitis  Pt febrile with tonsillar exudate, cervical lymphadenopathy, & dysphagia; diagnosis of strep. Treated in the Ed with steroids,PCN IM.  Pt appears mildly dehydrated, discussed importance of water rehydration. Presentation non concerning for PTA or infxn spread to soft tissue. No trismus or uvula deviation. Specific return precautions discussed. Pt able to drink water in ED without  difficulty with intact air way. Recommended PCP follow up.      Arthor Captain, PA-C 08/12/15 [address] PA-C 08/12/15 1333  Nelva Nay, MD 08/13/15 [phone]

## 2015-08-12 NOTE — ED Notes (Addendum)
Pt c/o 3 day history of sore throat. His daughter was recently with abx for strep. Reports fevers, chills, body aches, sweats. Painful to swallow

## 2015-08-12 NOTE — Discharge Instructions (Signed)

## 2015-09-04 ENCOUNTER — Emergency Department (HOSPITAL_COMMUNITY)
Admission: EM | Admit: 2015-09-04 | Discharge: 2015-09-04 | Disposition: A | Discharge status: Home / Self Care | Payer: Self-pay | Attending: Emergency Medicine | Admitting: Emergency Medicine

## 2015-09-04 ENCOUNTER — Encounter (HOSPITAL_COMMUNITY): Payer: Self-pay | Admitting: Emergency Medicine

## 2015-09-04 DIAGNOSIS — X58XXXA Exposure to other specified factors, initial encounter: Secondary | ICD-10-CM | POA: Insufficient documentation

## 2015-09-04 DIAGNOSIS — Y9389 Activity, other specified: Secondary | ICD-10-CM | POA: Insufficient documentation

## 2015-09-04 DIAGNOSIS — Y9289 Other specified places as the place of occurrence of the external cause: Secondary | ICD-10-CM | POA: Insufficient documentation

## 2015-09-04 DIAGNOSIS — Z79899 Other long term (current) drug therapy: Secondary | ICD-10-CM | POA: Insufficient documentation

## 2015-09-04 DIAGNOSIS — Z791 Long term (current) use of non-steroidal anti-inflammatories (NSAID): Secondary | ICD-10-CM | POA: Insufficient documentation

## 2015-09-04 DIAGNOSIS — Y998 Other external cause status: Secondary | ICD-10-CM | POA: Insufficient documentation

## 2015-09-04 DIAGNOSIS — F17210 Nicotine dependence, cigarettes, uncomplicated: Secondary | ICD-10-CM | POA: Insufficient documentation

## 2015-09-04 DIAGNOSIS — T783XXA Angioneurotic edema, initial encounter: Secondary | ICD-10-CM | POA: Insufficient documentation

## 2015-09-04 MED ORDER — DIPHENHYDRAMINE HCL 50 MG/ML IJ SOLN
25.0000 mg | Freq: Once | INTRAMUSCULAR | Status: AC
  Administered 2015-09-04: 25 mg via INTRAMUSCULAR
  Filled 2015-09-04: qty 1

## 2015-09-04 MED ORDER — PREDNISONE 10 MG PO TABS: ORAL_TABLET | ORAL | Status: AC

## 2015-09-04 MED ORDER — METHYLPREDNISOLONE SODIUM SUCC 125 MG IJ SOLR
125.0000 mg | Freq: Once | INTRAMUSCULAR | Status: AC
  Administered 2015-09-04: 125 mg via INTRAMUSCULAR
  Filled 2015-09-04: qty 2

## 2015-09-04 NOTE — ED Notes (Signed)
Patient states woke up this morning with top lip swelling.  Denies any trouble breathing or other symptoms.   Patient states hasn't taken any new medications or eaten anything out of the ordinary.

## 2015-09-04 NOTE — ED Provider Notes (Signed)
CSN: [id number]     Arrival date & time 09/04/15  4098 History   First MD Initiated Contact with Patient 09/04/15 [phone]     Chief Complaint  Patient presents with  . Angioedema     (Consider location/radiation/quality/duration/timing/severity/associated sxs/prior Treatment) HPI Comments: Pt woke up this morning with swelling to his right upper lip. Denies any problems swallowing or breathing. Hasn't used any new products. He is not on any blood pressure medications. He has not history of similar symptoms. He hasn't taken any medications  The history is provided by the patient. No language interpreter was used.    History reviewed. No pertinent past medical history. History reviewed. No pertinent past surgical history. No family history on file. Social History  Substance Use Topics  . Smoking status: Current Every Day Smoker -- 0.50 packs/day    Types: Cigarettes  . Smokeless tobacco: Never Used  . Alcohol Use: Yes     Comment: "a 12 pack and a 1/5 of liquor"    Review of Systems  All other systems reviewed and are negative.     Allergies  Review of patient's allergies indicates no known allergies.  Home Medications   Prior to Admission medications   Medication Sig Start Date End Date Taking? Authorizing Provider  acetaminophen (TYLENOL) 500 MG tablet Take 1,000 mg by mouth every 4 (four) hours as needed (pain).    Historical Provider, MD  cyclobenzaprine (FLEXERIL) 10 MG tablet Take 1 tablet (10 mg total) by mouth 2 (two) times daily as needed for muscle spasms. 08/07/15   Barrett Henle, PA-C  HYDROcodone-acetaminophen (HYCET) 7.5-325 mg/15 ml solution Take 15 mLs by mouth 4 (four) times daily as needed for moderate pain. 08/12/15 08/11/16  Arthor Captain, PA-C  ibuprofen (ADVIL,MOTRIN) 800 MG tablet Take 1 tablet (800 mg total) by mouth 3 (three) times daily. 08/07/15   Barrett Henle, PA-C  ondansetron (ZOFRAN ODT) 4 MG disintegrating tablet Take 1 tablet (4  mg total) by mouth every 8 (eight) hours as needed for nausea or vomiting. 07/03/15   Tiffany Neva Seat, PA-C  pantoprazole (PROTONIX) 20 MG tablet Take 1 tablet (20 mg total) by mouth daily. 07/03/15   Tiffany Neva Seat, PA-C  sucralfate (CARAFATE) 1 GM/10ML suspension Take 10 mLs (1 g total) by mouth 4 (four) times daily -  with meals and at bedtime. 07/03/15   Tiffany Neva Seat, PA-C   BP 128/83 mmHg  Pulse 94  Temp(Src) 98.3 F (36.8 C) (Oral)  Resp 18  Ht  (1.676 m)  Wt 68.04 kg  BMI 24.22 kg/m2  SpO2 100% Physical Exam  Constitutional: He is oriented to person, place, and time. He appears well-developed and well-nourished.  HENT:  Right Ear: External ear normal.  Left Ear: External ear normal.  Mouth/Throat: Oropharynx is clear and moist.  Swelling to the right upper lip  Cardiovascular: Normal rate and regular rhythm.   Pulmonary/Chest: Effort normal and breath sounds normal. He has no wheezes.  Musculoskeletal: Normal range of motion.  Neurological: He is alert and oriented to person, place, and time.  Skin: Skin is warm and dry. No rash noted.  Psychiatric: He has a normal mood and affect.  Nursing note and vitals reviewed.   ED Course  Procedures (including critical care time) Labs Review Labs Reviewed - No data to display  Imaging Review No results found. I have personally reviewed and evaluated these [id number] and lab results as part of my medical decision-making.   EKG Interpretation None  MDM   Final diagnoses:  Angioedema, initial encounter    Pt continues to not having any oral mucosal swelling. Pt is not having any problems breathing. Will send home with prednisone. Discussed strict return precautions as well as use of benadryl at home    Teressa Lower, NP 09/04/15 1610  Lavera Guise, MD 09/04/15 [phone]

## 2015-09-04 NOTE — Discharge Instructions (Signed)
Return as discussed for worsening or continued symptoms. If you develop any problems swallowing or breathing.  Angioedema Angioedema is sudden puffiness (swelling), often of the skin. It can happen:  On your face or privates (genitals).  In your belly (abdomen) or other body parts. It usually happens quickly and gets better in 1 or 2 days. It often starts at night and is found when you wake up. You may get red, itchy patches of skin (hives). Attacks can be dangerous if your breathing passages get puffy. The condition may happen only once, or it can come back at random times. It may happen for several years before it goes away for good. HOME CARE  Only take medicines as told by your doctor.  Always carry your emergency allergy medicines with you.  Wear a medical bracelet as told by your doctor.  Avoid things that you know will cause attacks (triggers). GET HELP IF:  You have another attack.  Your attacks happen more often or get worse.  The condition was passed to you by your parents and you want to have children. GET HELP RIGHT AWAY IF:   Your mouth, tongue, or lips are very puffy.  You have trouble breathing.  You have trouble swallowing.  You pass out (faint). MAKE SURE YOU:   Understand these instructions.  Will watch your condition.  Will get help right away if you are not doing well or get worse.   This information is not intended to replace advice given to you by your health care provider. Make sure you discuss any questions you have with your health care provider.   Document Released: 06/11/2009 Document Revised: 04/13/2013 Document Reviewed: 02/14/2013 Elsevier Interactive Patient Education [employer]

## 2016-07-19 ENCOUNTER — Emergency Department (HOSPITAL_BASED_OUTPATIENT_CLINIC_OR_DEPARTMENT_OTHER)
Admission: EM | Admit: 2016-07-19 | Discharge: 2016-07-19 | Disposition: A | Discharge status: Home / Self Care | Payer: Self-pay | Attending: Emergency Medicine | Admitting: Emergency Medicine

## 2016-07-19 ENCOUNTER — Encounter (HOSPITAL_BASED_OUTPATIENT_CLINIC_OR_DEPARTMENT_OTHER): Payer: Self-pay | Admitting: *Deleted

## 2016-07-19 ENCOUNTER — Emergency Department (HOSPITAL_BASED_OUTPATIENT_CLINIC_OR_DEPARTMENT_OTHER): Payer: Self-pay

## 2016-07-19 DIAGNOSIS — R112 Nausea with vomiting, unspecified: Secondary | ICD-10-CM | POA: Insufficient documentation

## 2016-07-19 DIAGNOSIS — R197 Diarrhea, unspecified: Secondary | ICD-10-CM | POA: Insufficient documentation

## 2016-07-19 DIAGNOSIS — R05 Cough: Secondary | ICD-10-CM | POA: Insufficient documentation

## 2016-07-19 DIAGNOSIS — Z79899 Other long term (current) drug therapy: Secondary | ICD-10-CM | POA: Insufficient documentation

## 2016-07-19 DIAGNOSIS — F17210 Nicotine dependence, cigarettes, uncomplicated: Secondary | ICD-10-CM | POA: Insufficient documentation

## 2016-07-19 DIAGNOSIS — R6889 Other general symptoms and signs: Secondary | ICD-10-CM

## 2016-07-19 DIAGNOSIS — R52 Pain, unspecified: Secondary | ICD-10-CM | POA: Insufficient documentation

## 2016-07-19 DIAGNOSIS — J029 Acute pharyngitis, unspecified: Secondary | ICD-10-CM | POA: Insufficient documentation

## 2016-07-19 DIAGNOSIS — R509 Fever, unspecified: Secondary | ICD-10-CM | POA: Insufficient documentation

## 2016-07-19 LAB — RAPID STREP SCREEN (MED CTR MEBANE ONLY): Streptococcus, Group A Screen (Direct): NEGATIVE

## 2016-07-19 MED ORDER — HYDROCODONE-HOMATROPINE 5-1.5 MG/5ML PO SYRP: 5.0000 mL | ORAL_SOLUTION | Freq: Four times a day (QID) | ORAL | 0 refills | Status: AC | PRN

## 2016-07-19 MED ORDER — IBUPROFEN 800 MG PO TABS
800.0000 mg | ORAL_TABLET | Freq: Once | ORAL | Status: AC
  Administered 2016-07-19: 800 mg via ORAL
  Filled 2016-07-19: qty 1

## 2016-07-19 MED ORDER — ONDANSETRON HCL 4 MG PO TABS: 4.0000 mg | ORAL_TABLET | Freq: Four times a day (QID) | ORAL | 0 refills | Status: AC

## 2016-07-19 MED ORDER — ONDANSETRON HCL 4 MG/2ML IJ SOLN
4.0000 mg | Freq: Once | INTRAMUSCULAR | Status: AC
  Administered 2016-07-19: 4 mg via INTRAVENOUS
  Filled 2016-07-19: qty 2

## 2016-07-19 MED ORDER — SODIUM CHLORIDE 0.9 % IV BOLUS (SEPSIS)
1000.0000 mL | Freq: Once | INTRAVENOUS | Status: AC
  Administered 2016-07-19: 1000 mL via INTRAVENOUS

## 2016-07-19 MED ORDER — ACETAMINOPHEN 325 MG PO TABS
650.0000 mg | ORAL_TABLET | Freq: Once | ORAL | Status: AC
  Administered 2016-07-19: 650 mg via ORAL
  Filled 2016-07-19: qty 2
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
## 2016-07-19 NOTE — ED Triage Notes (Signed)
Patient states he has a three day history of nausea, vomiting, diarrhea and fever up to 104.  Generalized body aches, sore throat and headache.

## 2016-07-19 NOTE — ED Notes (Signed)
Pt states he took 800mg  ibuprofen at approx 1000 this morning.

## 2016-07-19 NOTE — ED Notes (Signed)
Pt given d/c instructions as per chart. Verbalizes understanding. No questions. 

## 2016-07-20 LAB — CULTURE, GROUP A STREP (THRC)

## 2016-11-22 ENCOUNTER — Encounter (HOSPITAL_BASED_OUTPATIENT_CLINIC_OR_DEPARTMENT_OTHER): Payer: Self-pay | Admitting: Emergency Medicine

## 2016-11-22 ENCOUNTER — Emergency Department (HOSPITAL_BASED_OUTPATIENT_CLINIC_OR_DEPARTMENT_OTHER): Payer: Self-pay

## 2016-11-22 ENCOUNTER — Emergency Department (HOSPITAL_BASED_OUTPATIENT_CLINIC_OR_DEPARTMENT_OTHER)
Admission: EM | Admit: 2016-11-22 | Discharge: 2016-11-22 | Disposition: A | Discharge status: Home / Self Care | Payer: Self-pay | Attending: Emergency Medicine | Admitting: Emergency Medicine

## 2016-11-22 DIAGNOSIS — F17210 Nicotine dependence, cigarettes, uncomplicated: Secondary | ICD-10-CM | POA: Insufficient documentation

## 2016-11-22 DIAGNOSIS — Z79899 Other long term (current) drug therapy: Secondary | ICD-10-CM | POA: Insufficient documentation

## 2016-11-22 DIAGNOSIS — J209 Acute bronchitis, unspecified: Secondary | ICD-10-CM | POA: Insufficient documentation

## 2016-11-22 MED ORDER — FEXOFENADINE HCL 60 MG PO TABS: 60.0000 mg | ORAL_TABLET | Freq: Two times a day (BID) | ORAL | 0 refills | Status: AC

## 2016-11-22 MED ORDER — ALBUTEROL SULFATE HFA 108 (90 BASE) MCG/ACT IN AERS
2.0000 | INHALATION_SPRAY | RESPIRATORY_TRACT | Status: DC | PRN
  Administered 2016-11-22: 2 via RESPIRATORY_TRACT
  Filled 2016-11-22: qty 6.7
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
## 2016-11-22 NOTE — ED Triage Notes (Signed)
pateint states that he has had a productive cough for the last 2 days. THE OTC medications have not helped

## 2016-11-22 NOTE — Discharge Instructions (Signed)
Return to the ED with any concerns including difficulty breathing despite using albuterol every 4 hours, not drinking fluids, decreased urine output, vomiting and not able to keep down liquids or medications, decreased level of alertness/lethargy, or any other alarming symptoms °

## 2016-11-26 ENCOUNTER — Telehealth (HOSPITAL_BASED_OUTPATIENT_CLINIC_OR_DEPARTMENT_OTHER): Payer: Self-pay

## 2016-11-26 ENCOUNTER — Emergency Department (HOSPITAL_BASED_OUTPATIENT_CLINIC_OR_DEPARTMENT_OTHER): Admission: EM | Admit: 2016-11-26 | Discharge: 2016-11-26 | Discharge status: Absent without leave | Payer: Self-pay

## 2016-11-26 NOTE — Telephone Encounter (Signed)
Pt stating he lost his albuterol inhaler that was Rx to him after a visit to our ED over the weekend. Pt requesting to have another Rx filled out. His chart was reviewed and Dr. Corlis LeakMackuen issued pt a new prescription.

## 2019-05-15 IMAGING — CR DG CHEST 2V
2 series · 2 of 2 positions shown · non-contrast
Comparison: July 19, 2016

CLINICAL DATA: Chest congestion.  Shortness of breath.

EXAM:
CHEST  2 VIEW

[w chest pa]
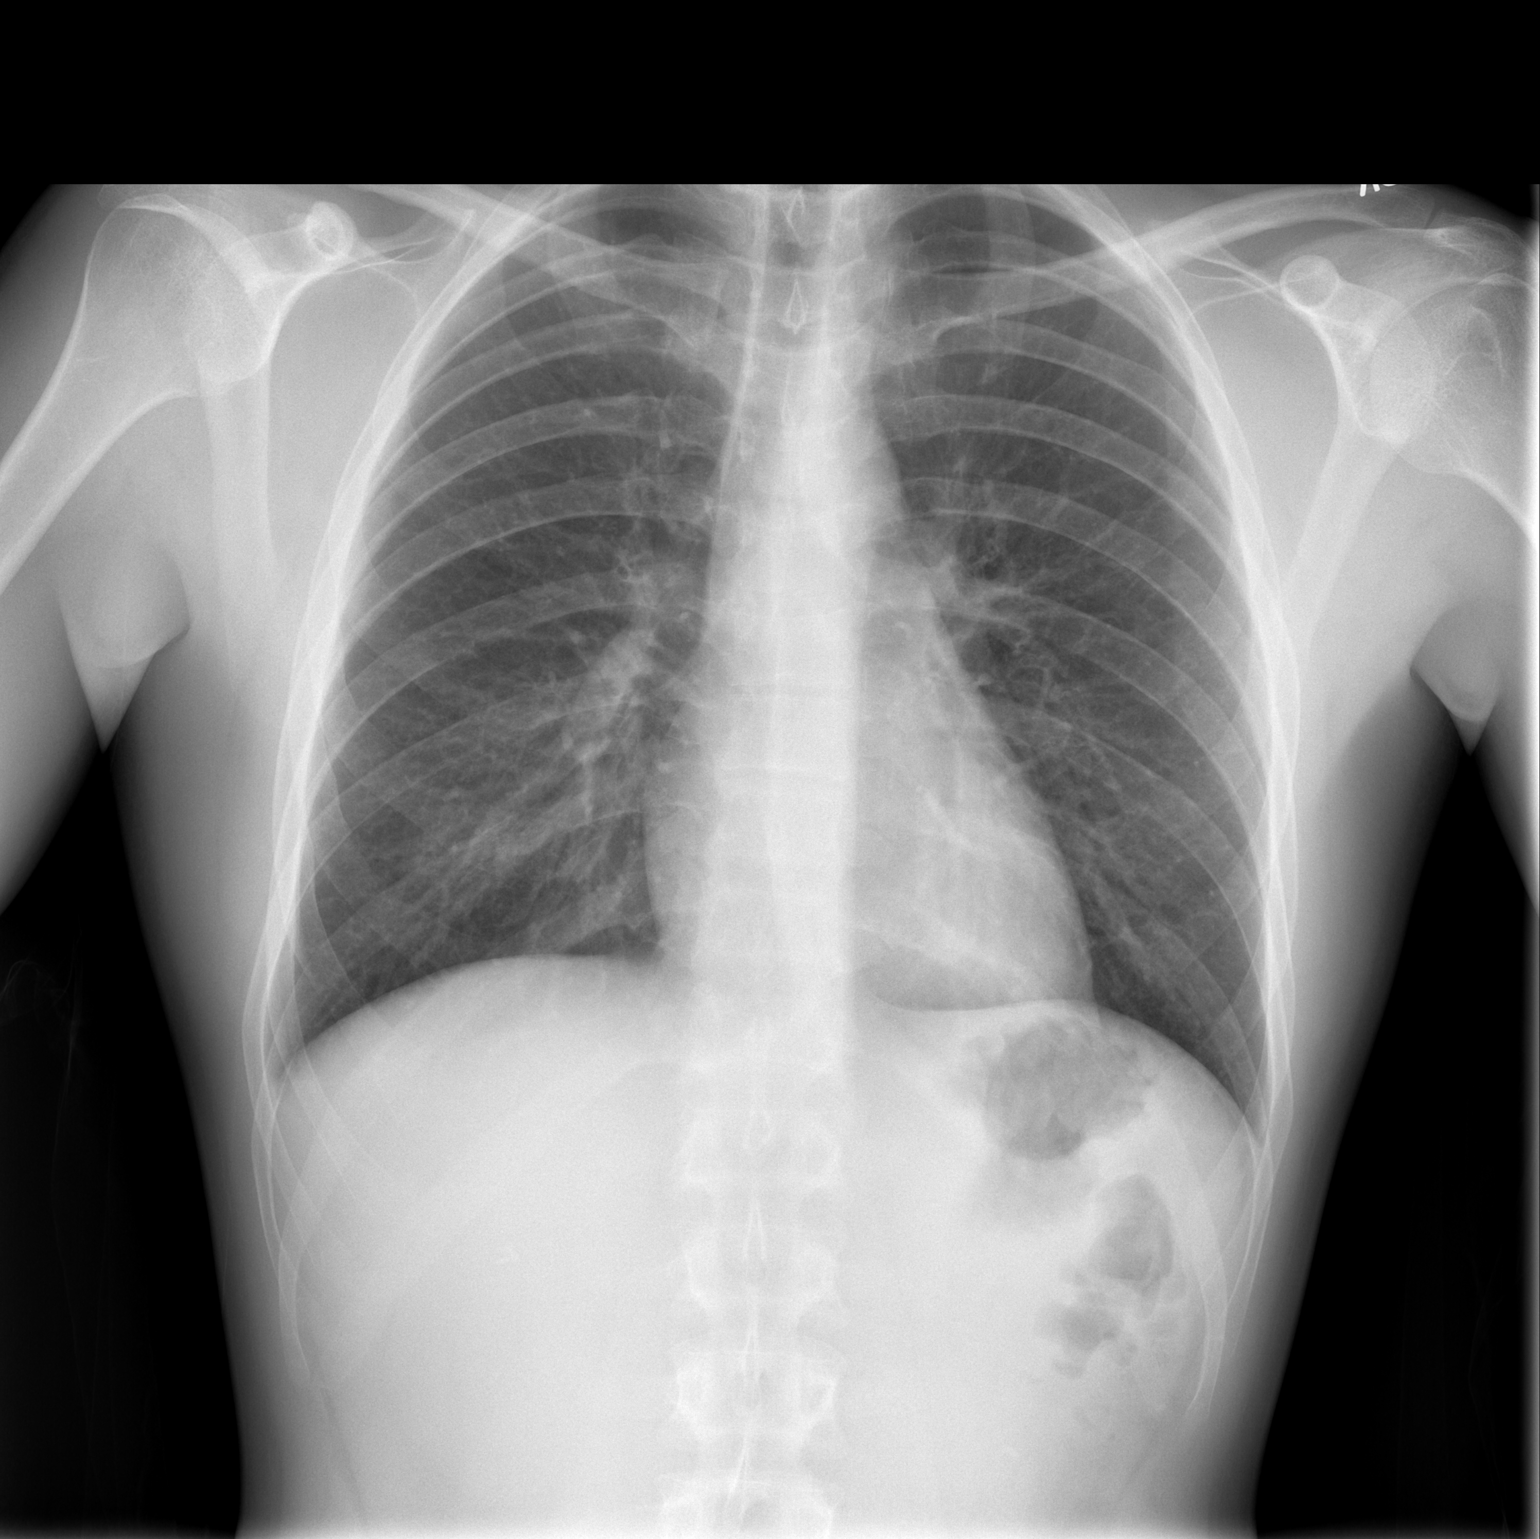

[w chest lat]
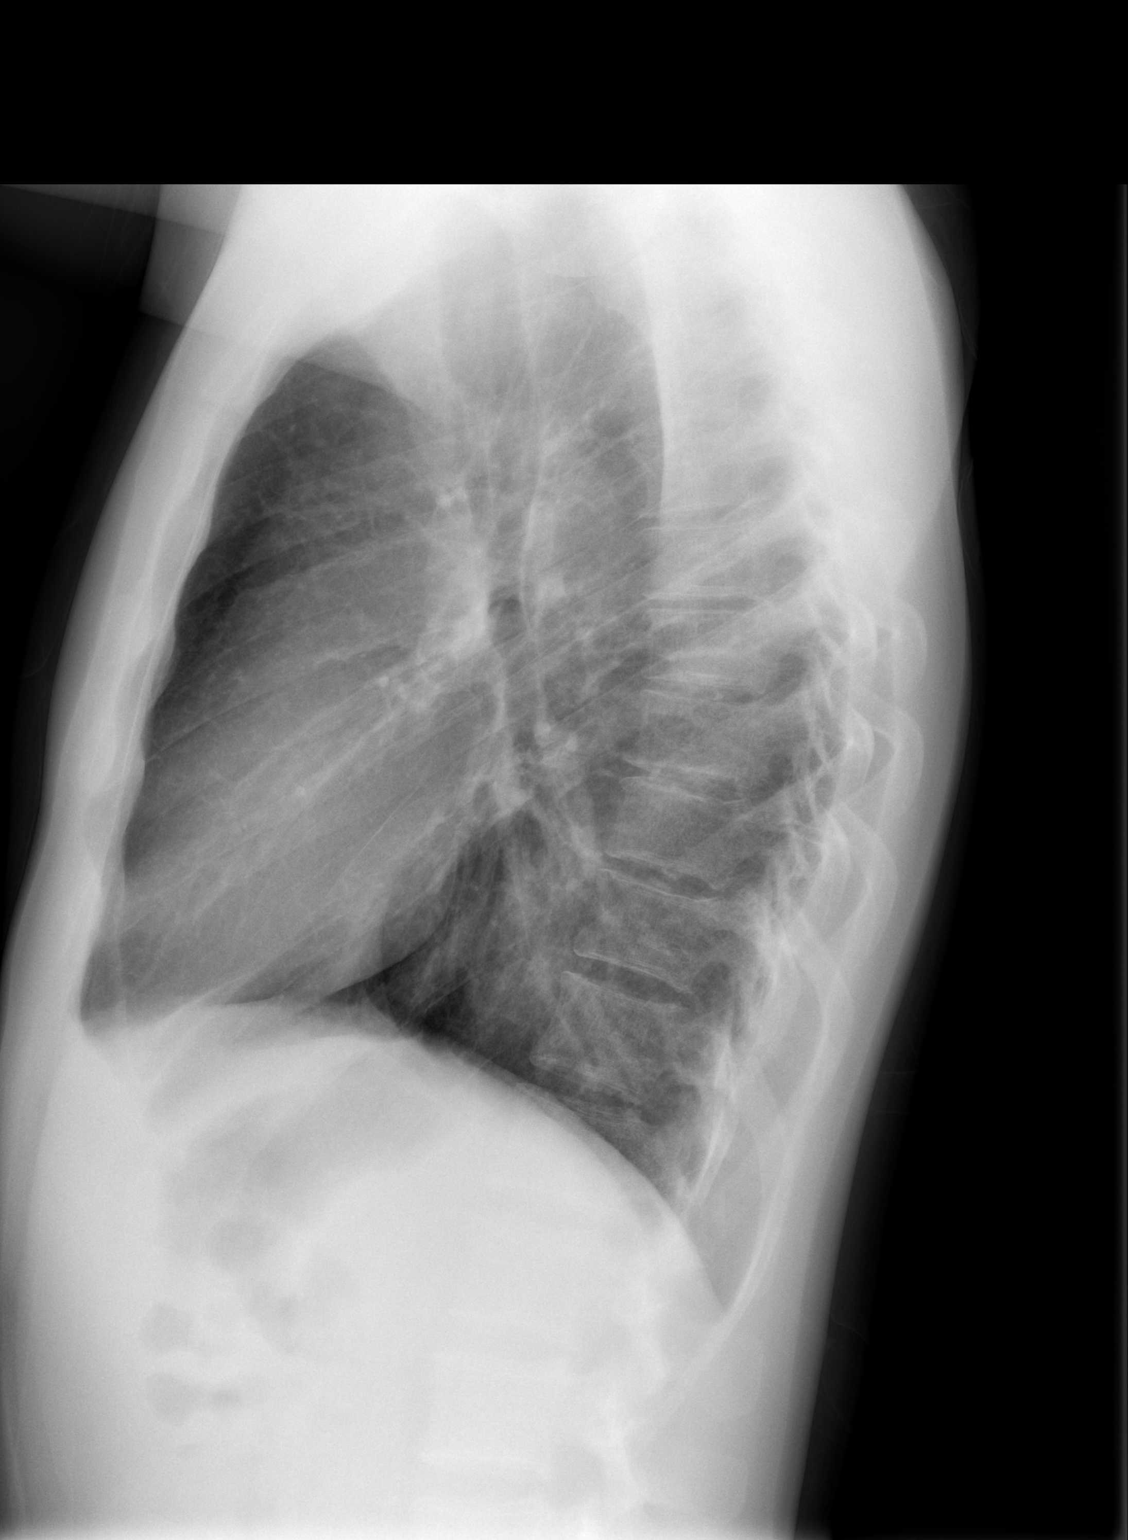

[2 of 2 positions shown; findings below may reference images not displayed]

FINDINGS: The heart size and mediastinal contours are within normal limits.
Both lungs are clear. The visualized skeletal structures are
unremarkable.
IMPRESSION: No active cardiopulmonary disease.
